# Patient Record
Sex: Male | Born: 1990 | Race: Black or African American | Hispanic: No | Marital: Single | Smoking: Current some day smoker
Health system: Southern US, Community
[De-identification: ages and names within clinical notes are randomized; demographics above are authoritative.]

## PROBLEM LIST (undated history)

## (undated) DIAGNOSIS — J45909 Unspecified asthma, uncomplicated: Secondary | ICD-10-CM

---

## 2014-09-19 ENCOUNTER — Emergency Department (HOSPITAL_COMMUNITY)
Admission: EM | Admit: 2014-09-19 | Discharge: 2014-09-19 | Discharge status: Absent without leave | Payer: Self-pay | Source: Home / Self Care

## 2014-11-05 ENCOUNTER — Encounter (HOSPITAL_COMMUNITY): Payer: Self-pay | Admitting: Emergency Medicine

## 2014-11-05 ENCOUNTER — Emergency Department (INDEPENDENT_AMBULATORY_CARE_PROVIDER_SITE_OTHER)
Admission: EM | Admit: 2014-11-05 | Discharge: 2014-11-05 | Disposition: A | Discharge status: Home / Self Care | Payer: Self-pay | Source: Home / Self Care | Attending: Emergency Medicine | Admitting: Emergency Medicine

## 2014-11-05 DIAGNOSIS — J4541 Moderate persistent asthma with (acute) exacerbation: Secondary | ICD-10-CM

## 2014-11-05 MED ORDER — IPRATROPIUM-ALBUTEROL 0.5-2.5 (3) MG/3ML IN SOLN
RESPIRATORY_TRACT | Status: AC
  Filled 2014-11-05: qty 3

## 2014-11-05 MED ORDER — ALBUTEROL SULFATE HFA 108 (90 BASE) MCG/ACT IN AERS: 2.0000 | INHALATION_SPRAY | RESPIRATORY_TRACT | Status: DC | PRN

## 2014-11-05 MED ORDER — METHYLPREDNISOLONE ACETATE 80 MG/ML IJ SUSP
80.0000 mg | Freq: Once | INTRAMUSCULAR | Status: AC
  Administered 2014-11-05: 80 mg via INTRAMUSCULAR

## 2014-11-05 MED ORDER — IPRATROPIUM-ALBUTEROL 0.5-2.5 (3) MG/3ML IN SOLN
3.0000 mL | Freq: Once | RESPIRATORY_TRACT | Status: AC
  Administered 2014-11-05: 3 mL via RESPIRATORY_TRACT

## 2014-11-05 MED ORDER — METHYLPREDNISOLONE ACETATE 80 MG/ML IJ SUSP
INTRAMUSCULAR | Status: AC
  Filled 2014-11-05: qty 1

## 2014-11-05 MED ORDER — ALBUTEROL SULFATE (2.5 MG/3ML) 0.083% IN NEBU: 2.5000 mg | INHALATION_SOLUTION | Freq: Four times a day (QID) | RESPIRATORY_TRACT | Status: DC | PRN

## 2014-11-05 NOTE — ED Provider Notes (Signed)
CSN: [id number]     Arrival date & time 11/05/14  1811 History   First MD Initiated Contact with Patient 11/05/14 1827     Chief Complaint  Patient presents with  . Asthma   (Consider location/radiation/quality/duration/timing/severity/associated sxs/prior Treatment) HPI  He is a 24 year old man here for evaluation of asthma. He states he has a long history of chronic asthma. He ran out of his albuterol inhaler and nebulizer several days ago. Over the last few days he has had increasing cough and wheezing. He reports feeling short of breath. No fevers or chills. 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 He states his asthma symptoms are often worse in the warehouse. He is not on any controller medications.  History reviewed. No pertinent past medical history. No past surgical history on file. History reviewed. No pertinent family history. History  Substance Use Topics  . Smoking status: Not on file  . Smokeless tobacco: Not on file  . Alcohol Use: Not on file    Review of Systems As in history of present illness Allergies  Review of patient's allergies indicates no known allergies.  Home Medications   Prior to Admission medications   Not on File   BP 111/80 mmHg  Pulse 77  Temp(Src) 98 F (36.7 C) (Oral)  Resp 18  SpO2 95% Physical Exam  Constitutional: He is oriented to person, place, and time. He appears well-developed and well-nourished. No distress.  Cardiovascular: Normal rate and regular rhythm.   No murmur heard. Pulmonary/Chest: Effort normal. No respiratory distress. He has wheezes (diffuse expiratory wheezes). He has no rales.  Neurological: He is alert and oriented to person, place, and time.    ED Course  Procedures (including critical care time) Labs Review Labs Reviewed - No data to display  Imaging Review No results found.   MDM  No diagnosis found. DuoNeb 2.5-0.5 mg given. Depo-Medrol 80 mg IM given.  Wheezing is completely  resolved after nebulizer. We'll discharge home with prescriptions for albuterol inhaler as well as nebulizer. Letter provided for work. Follow-up as needed.  Charm Rings, MD 11/05/14 [phone]

## 2014-11-05 NOTE — ED Notes (Signed)
C/o asthma States he has been out of meds for a while Would like refill on inhaler and neb  States sob due to weather

## 2014-11-05 NOTE — Discharge Instructions (Signed)
Use the albuterol every 4-6 hours as needed. I think you would benefit from being on a controller medicine. Please work on finding a primary care doctor.

## 2014-12-22 ENCOUNTER — Encounter (HOSPITAL_COMMUNITY): Payer: Self-pay | Admitting: Emergency Medicine

## 2014-12-22 ENCOUNTER — Emergency Department (INDEPENDENT_AMBULATORY_CARE_PROVIDER_SITE_OTHER)
Admission: EM | Admit: 2014-12-22 | Discharge: 2014-12-22 | Disposition: A | Discharge status: Home / Self Care | Payer: Medicaid Other | Source: Home / Self Care | Attending: Family Medicine | Admitting: Family Medicine

## 2014-12-22 DIAGNOSIS — J45901 Unspecified asthma with (acute) exacerbation: Secondary | ICD-10-CM

## 2014-12-22 HISTORY — DX: Unspecified asthma, uncomplicated: J45.909

## 2014-12-22 MED ORDER — DEXAMETHASONE 4 MG PO TABS
ORAL_TABLET | ORAL | Status: AC
  Filled 2014-12-22: qty 2

## 2014-12-22 MED ORDER — PREDNISONE 50 MG PO TABS: ORAL_TABLET | ORAL | Status: DC

## 2014-12-22 MED ORDER — ALBUTEROL SULFATE HFA 108 (90 BASE) MCG/ACT IN AERS: 2.0000 | INHALATION_SPRAY | RESPIRATORY_TRACT | Status: DC | PRN

## 2014-12-22 MED ORDER — ALBUTEROL SULFATE (2.5 MG/3ML) 0.083% IN NEBU: 2.5000 mg | INHALATION_SOLUTION | Freq: Four times a day (QID) | RESPIRATORY_TRACT | Status: DC | PRN

## 2014-12-22 MED ORDER — DEXAMETHASONE 2 MG PO TABS
ORAL_TABLET | ORAL | Status: AC
  Filled 2014-12-22: qty 1

## 2014-12-22 MED ORDER — DEXAMETHASONE 4 MG PO TABS
10.0000 mg | ORAL_TABLET | Freq: Once | ORAL | Status: AC
  Administered 2014-12-22: 10 mg via ORAL

## 2014-12-22 NOTE — Discharge Instructions (Signed)
Please use the albuterol every 4 hours as needed for your asthma Please do not use this any more than needed as it becomes less effective with time Please follow up with Maggy to get a regular doctor

## 2014-12-22 NOTE — ED Notes (Signed)
Needing refills on his asthma meds Does not have a PCP Alert and oriented x4... No acute distress.

## 2014-12-22 NOTE — ED Provider Notes (Signed)
CSN: [id number]     Arrival date & time 12/22/14  1430 History   First MD Initiated Contact with Patient 12/22/14 1525     Chief Complaint  Patient presents with  . Asthma   (Consider location/radiation/quality/duration/timing/severity/associated sxs/prior Treatment) HPI  Asthma flare. Patient with multiple recent asthma flares. Currently he has been coughing and wheezing for the last couple days. He has used his albuterol every 4 hours while at work which where he says his symptoms are worse. Symptoms improved with his albuterol. Denies any fevers, chest pain, nausea, vomiting, neck stiffness, headache, rash. Symptoms are intermittent.  Past Medical History  Diagnosis Date  . Asthma    History reviewed. No pertinent past surgical history. No family history on file. Social History  Substance Use Topics  . Smoking status: Never Smoker   . Smokeless tobacco: None  . Alcohol Use: No    Review of Systems Per HPI with all other pertinent systems negative.   Allergies  Review of patient's allergies indicates no known allergies.  Home Medications   Prior to Admission medications   Medication Sig Start Date End Date Taking? Authorizing Provider  albuterol (PROVENTIL HFA;VENTOLIN HFA) 108 (90 BASE) MCG/ACT inhaler Inhale 2 puffs into the lungs every 4 (four) hours as needed for wheezing or shortness of breath. 12/22/14   Ozella Rocks, MD  albuterol (PROVENTIL) (2.5 MG/3ML) 0.083% nebulizer solution Take 3 mLs (2.5 mg total) by nebulization every 6 (six) hours as needed for wheezing or shortness of breath. 12/22/14   Ozella Rocks, MD  predniSONE (DELTASONE) 50 MG tablet Take daily with breakfast 12/22/14   Ozella Rocks, MD   Meds Ordered and Administered this Visit   Medications  dexamethasone (DECADRON) tablet 10 mg (not administered)    BP 103/66 mmHg  Pulse 70  Temp(Src) 97.7 F (36.5 C) (Oral)  Resp 16  SpO2 97% No data found.   Physical Exam Physical Exam   Constitutional: oriented to person, place, and time. appears well-developed and well-nourished. No distress.  HENT:  Head: Normocephalic and atraumatic.  Eyes: EOMI. PERRL.  Neck: Normal range of motion.  Cardiovascular: RRR, no m/r/g, 2+ distal pulses,  Pulmonary/Chest: Decreased breath sounds throughout, wheezing throughout, no rhonchi or crackles..  Abdominal: Soft. Bowel sounds are normal. NonTTP, no distension.  Musculoskeletal: Normal range of motion. Non ttp, no effusion.  Neurological: alert and oriented to person, place, and time.  Skin: Skin is warm. No rash noted. non diaphoretic.  Psychiatric: normal mood and affect. behavior is normal. Judgment and thought content normal.   ED Course  Procedures (including critical care time)  Labs Review Labs Reviewed - No data to display  Imaging Review No results found.   Visual Acuity Review  Right Eye Distance:   Left Eye Distance:   Bilateral Distance:    Right Eye Near:   Left Eye Near:    Bilateral Near:         MDM   1. Asthma attack    Patient refusing albuterol treatment at this point time. We'll give Decadron 10 mg by mouth. Patient to continue steroids for 5 days and use albuterol every 4 hours for the next 24 hours and every 4 hours as needed. Will refill patient's MDI as well as albuterol nebs.    Ozella Rocks, MD 12/22/14 [phone]

## 2014-12-24 ENCOUNTER — Inpatient Hospital Stay (HOSPITAL_COMMUNITY)
Admission: EM | Admit: 2014-12-24 | Discharge: 2014-12-28 | DRG: 208 | Disposition: A | Discharge status: Home / Self Care | Payer: Medicaid Other | Attending: Pulmonary Disease | Admitting: Pulmonary Disease

## 2014-12-24 ENCOUNTER — Encounter (HOSPITAL_COMMUNITY): Payer: Self-pay

## 2014-12-24 ENCOUNTER — Emergency Department (HOSPITAL_COMMUNITY): Payer: Medicaid Other

## 2014-12-24 DIAGNOSIS — J45902 Unspecified asthma with status asthmaticus: Secondary | ICD-10-CM

## 2014-12-24 DIAGNOSIS — J9601 Acute respiratory failure with hypoxia: Secondary | ICD-10-CM | POA: Diagnosis not present

## 2014-12-24 DIAGNOSIS — T380X5A Adverse effect of glucocorticoids and synthetic analogues, initial encounter: Secondary | ICD-10-CM | POA: Diagnosis present

## 2014-12-24 DIAGNOSIS — R739 Hyperglycemia, unspecified: Secondary | ICD-10-CM | POA: Diagnosis present

## 2014-12-24 DIAGNOSIS — J4552 Severe persistent asthma with status asthmaticus: Secondary | ICD-10-CM | POA: Diagnosis not present

## 2014-12-24 DIAGNOSIS — J9600 Acute respiratory failure, unspecified whether with hypoxia or hypercapnia: Secondary | ICD-10-CM

## 2014-12-24 DIAGNOSIS — F70 Mild intellectual disabilities: Secondary | ICD-10-CM | POA: Diagnosis present

## 2014-12-24 DIAGNOSIS — I959 Hypotension, unspecified: Secondary | ICD-10-CM | POA: Diagnosis not present

## 2014-12-24 DIAGNOSIS — R40243 Glasgow coma scale score 3-8: Secondary | ICD-10-CM | POA: Diagnosis present

## 2014-12-24 DIAGNOSIS — Z7952 Long term (current) use of systemic steroids: Secondary | ICD-10-CM

## 2014-12-24 DIAGNOSIS — E861 Hypovolemia: Secondary | ICD-10-CM | POA: Diagnosis present

## 2014-12-24 DIAGNOSIS — J45901 Unspecified asthma with (acute) exacerbation: Secondary | ICD-10-CM | POA: Diagnosis present

## 2014-12-24 DIAGNOSIS — Z79899 Other long term (current) drug therapy: Secondary | ICD-10-CM

## 2014-12-24 DIAGNOSIS — R451 Restlessness and agitation: Secondary | ICD-10-CM | POA: Diagnosis not present

## 2014-12-24 LAB — POCT I-STAT 3, ART BLOOD GAS (G3+)
ACID-BASE DEFICIT: 4 mmol/L — AB (ref 0.0–2.0)
Bicarbonate: 27.5 mEq/L — ABNORMAL HIGH (ref 20.0–24.0)
O2 Saturation: 100 %
PO2 ART: 459 mmHg — AB (ref 80.0–100.0)
Patient temperature: 97.5
TCO2: 30 mmol/L (ref 0–100)
pCO2 arterial: 81.9 mmHg (ref 35.0–45.0)
pH, Arterial: 7.13 — CL (ref 7.350–7.450)

## 2014-12-24 LAB — COMPREHENSIVE METABOLIC PANEL
ALK PHOS: 48 U/L (ref 38–126)
ALT: 26 U/L (ref 17–63)
AST: 40 U/L (ref 15–41)
Albumin: 3.9 g/dL (ref 3.5–5.0)
Anion gap: 10 (ref 5–15)
BUN: 11 mg/dL (ref 6–20)
CALCIUM: 8.9 mg/dL (ref 8.9–10.3)
CHLORIDE: 104 mmol/L (ref 101–111)
CO2: 24 mmol/L (ref 22–32)
CREATININE: 1.1 mg/dL (ref 0.61–1.24)
Glucose, Bld: 219 mg/dL — ABNORMAL HIGH (ref 65–99)
Potassium: 3.5 mmol/L (ref 3.5–5.1)
Sodium: 138 mmol/L (ref 135–145)
Total Bilirubin: 1 mg/dL (ref 0.3–1.2)
Total Protein: 6.8 g/dL (ref 6.5–8.1)

## 2014-12-24 LAB — URINALYSIS, ROUTINE W REFLEX MICROSCOPIC
BILIRUBIN URINE: NEGATIVE
GLUCOSE, UA: 250 mg/dL — AB
Ketones, ur: NEGATIVE mg/dL
Leukocytes, UA: NEGATIVE
Nitrite: NEGATIVE
Protein, ur: 30 mg/dL — AB
SPECIFIC GRAVITY, URINE: 1.011 (ref 1.005–1.030)
Urobilinogen, UA: 0.2 mg/dL (ref 0.0–1.0)
pH: 5 (ref 5.0–8.0)

## 2014-12-24 LAB — CBC WITH DIFFERENTIAL/PLATELET
BASOS PCT: 0 % (ref 0–1)
Basophils Absolute: 0 10*3/uL (ref 0.0–0.1)
EOS PCT: 2 % (ref 0–5)
Eosinophils Absolute: 0.5 10*3/uL (ref 0.0–0.7)
HEMATOCRIT: 43.7 % (ref 39.0–52.0)
HEMOGLOBIN: 15 g/dL (ref 13.0–17.0)
LYMPHS PCT: 28 % (ref 12–46)
Lymphs Abs: 7.4 10*3/uL — ABNORMAL HIGH (ref 0.7–4.0)
MCH: 29.4 pg (ref 26.0–34.0)
MCHC: 34.3 g/dL (ref 30.0–36.0)
MCV: 85.5 fL (ref 78.0–100.0)
MONOS PCT: 8 % (ref 3–12)
Monocytes Absolute: 2.1 10*3/uL — ABNORMAL HIGH (ref 0.1–1.0)
NEUTROS ABS: 16.6 10*3/uL — AB (ref 1.7–7.7)
NEUTROS PCT: 62 % (ref 43–77)
Platelets: 279 10*3/uL (ref 150–400)
RBC: 5.11 MIL/uL (ref 4.22–5.81)
RDW: 12.6 % (ref 11.5–15.5)
WBC: 26.6 10*3/uL — ABNORMAL HIGH (ref 4.0–10.5)

## 2014-12-24 LAB — URINE MICROSCOPIC-ADD ON

## 2014-12-24 LAB — MRSA PCR SCREENING: MRSA by PCR: NEGATIVE

## 2014-12-24 MED ORDER — ALBUTEROL SULFATE (2.5 MG/3ML) 0.083% IN NEBU
2.5000 mg | INHALATION_SOLUTION | RESPIRATORY_TRACT | Status: DC
  Administered 2014-12-24 – 2014-12-25 (×5): 2.5 mg via RESPIRATORY_TRACT
  Filled 2014-12-24 (×5): qty 3

## 2014-12-24 MED ORDER — ALBUTEROL (5 MG/ML) CONTINUOUS INHALATION SOLN
INHALATION_SOLUTION | RESPIRATORY_TRACT | Status: AC
  Filled 2014-12-24: qty 20

## 2014-12-24 MED ORDER — PANTOPRAZOLE SODIUM 40 MG PO TBEC: 40.0000 mg | DELAYED_RELEASE_TABLET | Freq: Every day | ORAL | Status: DC

## 2014-12-24 MED ORDER — DEXTROSE 5 % IV SOLN
0.0000 ug/min | INTRAVENOUS | Status: DC
  Administered 2014-12-24 – 2014-12-25 (×2): 30 ug/min via INTRAVENOUS
  Filled 2014-12-24 (×2): qty 4

## 2014-12-24 MED ORDER — HEPARIN SODIUM (PORCINE) 5000 UNIT/ML IJ SOLN
5000.0000 [IU] | Freq: Three times a day (TID) | INTRAMUSCULAR | Status: DC
  Administered 2014-12-24 – 2014-12-28 (×11): 5000 [IU] via SUBCUTANEOUS
  Filled 2014-12-24 (×11): qty 1

## 2014-12-24 MED ORDER — PANTOPRAZOLE SODIUM 40 MG PO PACK
40.0000 mg | PACK | Freq: Every day | ORAL | Status: DC
  Administered 2014-12-24 – 2014-12-25 (×2): 40 mg
  Filled 2014-12-24 (×3): qty 20

## 2014-12-24 MED ORDER — ANTISEPTIC ORAL RINSE SOLUTION (CORINZ)
7.0000 mL | OROMUCOSAL | Status: DC
  Administered 2014-12-25 – 2014-12-26 (×15): 7 mL via OROMUCOSAL

## 2014-12-24 MED ORDER — ALBUTEROL SULFATE (2.5 MG/3ML) 0.083% IN NEBU
5.0000 mg | INHALATION_SOLUTION | RESPIRATORY_TRACT | Status: DC | PRN
  Administered 2014-12-24 – 2014-12-25 (×2): 5 mg via RESPIRATORY_TRACT
  Filled 2014-12-24 (×3): qty 6

## 2014-12-24 MED ORDER — SODIUM CHLORIDE 0.9 % IV SOLN
INTRAVENOUS | Status: AC | PRN
  Administered 2014-12-24: 1000 mL via INTRAVENOUS

## 2014-12-24 MED ORDER — MIDAZOLAM HCL 2 MG/2ML IJ SOLN
INTRAMUSCULAR | Status: AC
  Administered 2014-12-24: 4 mg
  Filled 2014-12-24: qty 4

## 2014-12-24 MED ORDER — PROPOFOL 1000 MG/100ML IV EMUL
5.0000 ug/kg/min | Freq: Once | INTRAVENOUS | Status: DC
  Administered 2014-12-24: 1000 mg via INTRAVENOUS

## 2014-12-24 MED ORDER — SODIUM CHLORIDE 0.9 % IV SOLN: 250.0000 mL | INTRAVENOUS | Status: DC | PRN

## 2014-12-24 MED ORDER — CHLORHEXIDINE GLUCONATE 0.12% ORAL RINSE (MEDLINE KIT)
15.0000 mL | Freq: Two times a day (BID) | OROMUCOSAL | Status: DC
  Administered 2014-12-24 – 2014-12-26 (×4): 15 mL via OROMUCOSAL

## 2014-12-24 MED ORDER — SODIUM CHLORIDE 0.9 % IV SOLN
25.0000 ug/h | INTRAVENOUS | Status: DC
  Administered 2014-12-24: 50 ug/h via INTRAVENOUS
  Administered 2014-12-25: 100 ug/h via INTRAVENOUS
  Filled 2014-12-24 (×2): qty 50

## 2014-12-24 MED ORDER — ETOMIDATE 2 MG/ML IV SOLN
INTRAVENOUS | Status: AC | PRN
  Administered 2014-12-24: 20 mg via INTRAVENOUS

## 2014-12-24 MED ORDER — ROCURONIUM BROMIDE 50 MG/5ML IV SOLN
INTRAVENOUS | Status: AC | PRN
  Administered 2014-12-24: 70 mg via INTRAVENOUS

## 2014-12-24 MED ORDER — ALBUTEROL (5 MG/ML) CONTINUOUS INHALATION SOLN
10.0000 mg/h | INHALATION_SOLUTION | RESPIRATORY_TRACT | Status: DC
  Administered 2014-12-24: 10 mg/h via RESPIRATORY_TRACT
  Filled 2014-12-24: qty 20

## 2014-12-24 MED ORDER — PROPOFOL 1000 MG/100ML IV EMUL
INTRAVENOUS | Status: AC
  Administered 2014-12-24: 1000 mg via INTRAVENOUS
  Filled 2014-12-24: qty 100

## 2014-12-24 MED ORDER — PROPOFOL 1000 MG/100ML IV EMUL
5.0000 ug/kg/min | INTRAVENOUS | Status: DC
  Administered 2014-12-24 – 2014-12-25 (×2): 50 ug/kg/min via INTRAVENOUS
  Administered 2014-12-25 – 2014-12-26 (×3): 40 ug/kg/min via INTRAVENOUS
  Filled 2014-12-24 (×6): qty 100

## 2014-12-24 MED ORDER — MIDAZOLAM HCL 2 MG/2ML IJ SOLN: 4.0000 mg | Freq: Once | INTRAMUSCULAR | Status: DC

## 2014-12-24 MED ORDER — METHYLPREDNISOLONE SODIUM SUCC 125 MG IJ SOLR
80.0000 mg | Freq: Two times a day (BID) | INTRAMUSCULAR | Status: DC
  Administered 2014-12-24: 80 mg via INTRAVENOUS
  Filled 2014-12-24: qty 2

## 2014-12-24 NOTE — ED Notes (Signed)
Critical Care paged to [phone] .

## 2014-12-24 NOTE — ED Notes (Signed)
RT at bedside.

## 2014-12-24 NOTE — Progress Notes (Signed)
eLink Physician-Brief Progress Note Patient Name: Jeremy Molina DOB: 1990/11/16 MRN: [id number]   Date of Service  12/24/2014  HPI/Events of Note  Severe agitation on vent despite propofol Still awaiting ABG  eICU Interventions  Fentanyl gtt Versed  IV bolus now ABG now     Intervention Category Major Interventions: Change in mental status - evaluation and management  [name] 12/24/2014, 9:18 PM

## 2014-12-24 NOTE — ED Notes (Signed)
Propofol increased per critical care md increase to 40 mcg/kg/min

## 2014-12-24 NOTE — ED Notes (Signed)
Patient here for resp distress, hx of asthma, had attack today and on arrival to ed pt obtunded on nrb. Per ems could not tolerate cpap and attempted to intubate but unsuccsessful.

## 2014-12-24 NOTE — Code Documentation (Signed)
Preparing for intubation

## 2014-12-24 NOTE — ED Notes (Signed)
By ems pt was given solumedrol 125 mg, albuterol 15 atrovent 1. And magnesium 2 gm and 0.3 epi.

## 2014-12-24 NOTE — ED Notes (Signed)
RT at bedside setting up for Arterial Line Placement.

## 2014-12-24 NOTE — Progress Notes (Signed)
eLink Physician-Brief Progress Note Patient Name: Jeremy Molina DOB: 11/22/1990 MRN: [id number]   Date of Service  12/24/2014  HPI/Events of Note  Respiratory failure ABG with 7.13/80/440  eICU Interventions  Decrease FiO2 Change vent settings to RR 20, TVol 8cc is OK for now Will permit hypercapnea, as he is oxygenating well and his circulation is intact>  need to avoid barotrauma Add propofol to fentanyl gtt for sedation     Intervention Category Major Interventions: Respiratory failure - evaluation and management  MCQUAID, DOUGLAS 12/24/2014, 9:57 PM

## 2014-12-24 NOTE — H&P (Signed)
PULMONARY / CRITICAL CARE MEDICINE HISTORY AND PHYSICAL EXAMINATION   Name: Jeremy Molina MRN: [id number] DOB: [birth year]    ADMISSION DATE:  12/24/2014  PRIMARY SERVICE: PCCM  CHIEF COMPLAINT:  Respiratory Failure  BRIEF PATIENT DESCRIPTION: 25 M with severe persistent asthma presenting with respiratory failure 2/2 likely status asthmaticus. Intubated in ED. PCCM asked to admit.  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 He is currently intubated and sedated and unable to provide any history. His aunt and uncle accompany him. They note that he first developed SOB on 9/8 which responded initially to albuterol. It worsened on 9/9 and he presented to urgent care. He was given steroids and nebulized albuterol and sent home. He breathing worsened throughout the weekend and his aunt and uncle called EMS this evening. By the time the patient arrived at [facility] he was obtunded and was intubated.   PAST MEDICAL HISTORY :  Past Medical History  Diagnosis Date  . Asthma    History reviewed. No pertinent past surgical history. Prior to Admission medications   Medication Sig Start Date End Date Taking? Authorizing Provider  albuterol (PROVENTIL HFA;VENTOLIN HFA) 108 (90 BASE) MCG/ACT inhaler Inhale 2 puffs into the lungs every 4 (four) hours as needed for wheezing or shortness of breath. 12/22/14   Ozella Rocks, MD  albuterol (PROVENTIL) (2.5 MG/3ML) 0.083% nebulizer solution Take 3 mLs (2.5 mg total) by nebulization every 6 (six) hours as needed for wheezing or shortness of breath. 12/22/14   Ozella Rocks, MD  predniSONE (DELTASONE) 50 MG tablet Take daily with breakfast 12/22/14   Ozella Rocks, MD   No Known Allergies  FAMILY HISTORY:   History reviewed. No pertinent family history. SOCIAL HISTORY:  reports that he has never smoked. He does not have any smokeless tobacco history on file. He reports that he does not drink alcohol or use illicit drugs.  REVIEW OF SYSTEMS:  Unable to obtain secondary to patient condition.  SUBJECTIVE:   VITAL SIGNS: Temp:  [96.4 F (35.8 C)-97.5 F (36.4 C)] 97.5 F (36.4 C) (09/11 2145) Pulse Rate:  [74-131] 98 (09/11 2220) Resp:  [14-20] 20 (09/11 2220) BP: (68-160)/(38-103) 88/40 mmHg (09/11 2215) SpO2:  [99 %-100 %] 100 % (09/11 2220) Arterial Line BP: (77-115)/(39-56) 96/51 mmHg (09/11 2220) FiO2 (%):  [40 %-50 %] 40 % (09/11 2208) Weight:  [139 lb 15.9 oz (63.5 kg)-140 lb (63.504 kg)] 139 lb 15.9 oz (63.5 kg) (09/11 2145) HEMODYNAMICS:   VENTILATOR SETTINGS: Vent Mode:  [-] PRVC FiO2 (%):  [40 %-50 %] 40 % Set Rate:  [20 bmp] 20 bmp Vt Set:  [782 mL] 620 mL PEEP:  [5 cmH20] 5 cmH20 Plateau Pressure:  [21 cmH20] 21 cmH20 INTAKE / OUTPUT: Intake/Output      09/11 0701 - 09/12 0700   I.V. (mL/kg) 510 (8)   Total Intake(mL/kg) 510 (8)   Net +510         PHYSICAL EXAMINATION: General:  WDWN M intubated and sedated Neuro:  Sedated, HEENT:  Sclera anicteric, conjunctiva pink, MMM, ETT present Neck: Trachea supple and midline, mild bilaleral cervical LAN Cardiovascular:  RRR, NS1/S2, (-) MRG Lungs:  Absent breath sounds posteriorly, wheezing diffusely anteriorly Abdomen:  S/NT/ND/(+)BS Musculoskeletal:  (-)  C/C/E Skin:  No rashes  LABS:  CBC  Recent Labs Lab 12/24/14 1939  WBC 26.6*  HGB 15.0  HCT 43.7  PLT 279   Coag's No results for input(s): APTT, INR in the last 168 hours. BMET  Recent Labs Lab 12/24/14 1939  NA 138  K 3.5  CL 104  CO2 24  BUN 11  CREATININE 1.10  GLUCOSE 219*   Electrolytes  Recent Labs Lab 12/24/14 1939  CALCIUM 8.9   Sepsis Markers No results for input(s): LATICACIDVEN, PROCALCITON, O2SATVEN in the last 168  hours. ABG  Recent Labs Lab 12/24/14 2146  PHART 7.130*  PCO2ART 81.9*  PO2ART 459.0*   Liver Enzymes  Recent Labs Lab 12/24/14 1939  AST 40  ALT 26  ALKPHOS 48  BILITOT 1.0  ALBUMIN 3.9   Cardiac Enzymes No results for input(s): TROPONINI, PROBNP in the last 168 hours. Glucose No results for input(s): GLUCAP in the last 168 hours.  Imaging Dg Chest Portable 1 View  12/24/2014   CLINICAL DATA:  Intubation.  Asthma.  EXAM: PORTABLE CHEST - 1 VIEW  COMPARISON:  None.  FINDINGS: Support apparatus: Endotracheal tube tip 58 mm from the carina. This could be advanced 1 or 2 cm. Enteric tube is present with the tip not visible. Monitoring leads project over the chest.  Cardiomediastinal Silhouette: Cardiopericardial silhouette within normal limits.  Lungs: Lungs appear normal. No pneumothorax. Both costophrenic angles are excluded from view.  Effusions:  None.  Other:  None.  IMPRESSION: 1. Endotracheal tube tip 58 mm from the carina. 2. No active cardiopulmonary disease.   Electronically Signed   By: Andreas Newport M.D.   On: 12/24/2014 20:19    EKG: Not obtained. CXR: Hyperinflated.   ASSESSMENT / PLAN:  Active Problems:   Status asthmaticus   PULMONARY A: Status Asthmaticus: Possibly precipitated by respiratory virus. Asthma very poorly controlled.  P:   Lung protective ventilation. Target pH of ~ 7.1-7.2 with careful vent titration to avoid autopeep; no autopeep detected on current rate of 14 q6 ABG Aggressive IV steroids q4 Albuterol and q1 PRNs Patient should not be discharged from the hospital without controller medication regimen, medicines in hand or confirmation that he can pay for them, and follow-up with a pulmonologist arranged  CARDIOVASCULAR A: No acute issue  RENAL A: No acute issue   GASTROINTESTINAL A: No acute issue.  P:   Nutrition consulted in anticipation of prolong ventilation  HEMATOLOGIC A: No acute issue, elevated WBC count almost  certainly 2/2 recent steroids   INFECTIOUS A: No acute issue P:   Check RVP  ENDOCRINE A: Hyperylcemia: Likely steroid induced P:   SSI Check A1c given glucose in urine  NEUROLOGIC A: No acute issues  BEST PRACTICE / DISPOSITION Level of Care:  ICU Primary Service:  PCCM Consultants:  None  Code Status:  Full Diet:  Consulting nutrition DVT Px:  SQH GI Px:  PPI Skin Integrity:  Intact Social / Family:  Aunt/Uncle updated at bedside by AB on 9/11  TODAY'S SUMMARY:   I have personally obtained a history, examined the patient, evaluated laboratory and imaging results, formulated the assessment and plan and placed orders.  CRITICAL CARE: The patient is critically ill with multiple organ systems failure and requires high complexity decision making for assessment and support, frequent evaluation and titration of therapies, application of advanced monitoring technologies and extensive interpretation of multiple databases. Critical Care Time devoted to patient care services described in this note  is 60 minutes.   Evalyn Casco, MD Pulmonary and Critical Care Medicine [facility] Pager: [phone]   12/24/2014, 10:53 PM

## 2014-12-24 NOTE — ED Provider Notes (Signed)
CSN: [id number]     Arrival date & time 12/24/14  1934 History   First MD Initiated Contact with Patient 12/24/14 1959     Chief Complaint  Patient presents with  . Respiratory Arrest     (Consider location/radiation/quality/duration/timing/severity/associated sxs/prior Treatment) Patient is a 24 y.o. male presenting with shortness of breath.  Shortness of Breath Severity:  Severe Onset quality:  Gradual Duration:  2 days Timing:  Constant Progression:  Worsening Chronicity:  Chronic Context: URI   Relieved by:  Nothing Ineffective treatments:  Inhaler (steroids) Associated symptoms: wheezing     Past Medical History  Diagnosis Date  . Asthma    History reviewed. No pertinent past surgical history. History reviewed. No pertinent family history. Social History  Substance Use Topics  . Smoking status: Never Smoker   . Smokeless tobacco: None  . Alcohol Use: No    Review of Systems  Unable to perform ROS: Acuity of condition  Respiratory: Positive for shortness of breath and wheezing.       Allergies  Review of patient's allergies indicates no known allergies.  Home Medications   Prior to Admission medications   Medication Sig Start Date End Date Taking? Authorizing Provider  albuterol (PROVENTIL HFA;VENTOLIN HFA) 108 (90 BASE) MCG/ACT inhaler Inhale 2 puffs into the lungs every 4 (four) hours as needed for wheezing or shortness of breath. 12/22/14   Ozella Rocks, MD  albuterol (PROVENTIL) (2.5 MG/3ML) 0.083% nebulizer solution Take 3 mLs (2.5 mg total) by nebulization every 6 (six) hours as needed for wheezing or shortness of breath. 12/22/14   Ozella Rocks, MD  predniSONE (DELTASONE) 50 MG tablet Take daily with breakfast 12/22/14   Ozella Rocks, MD   BP 88/40 mmHg  Pulse 98  Temp(Src) 97.5 F (36.4 C) (Oral)  Resp 20  Ht 6' (1.829 m)  Wt 139 lb 15.9 oz (63.5 kg)  BMI 18.98 kg/m2  SpO2 100% Physical Exam  Constitutional: He appears well-developed and  well-nourished. He appears distressed.  HENT:  Head: Normocephalic and atraumatic.  Eyes: EOM are normal.  Neck: Normal range of motion.  Cardiovascular: Normal heart sounds.  Tachycardia present.   No murmur heard. Pulmonary/Chest: He is in respiratory distress. He has wheezes.  Abdominal: Soft. There is no tenderness.  Musculoskeletal: He exhibits no edema.  Neurological: He is unresponsive. GCS eye subscore is 1. GCS verbal subscore is 1. GCS motor subscore is 4.  Skin: No rash noted. He is diaphoretic.    ED Course  INTUBATION Date/Time: 12/24/2014 8:07 PM Performed by: Beverely Risen Authorized by: Beverely Risen Consent: The procedure was performed in an emergent situation. Indications: respiratory distress Intubation method: video-assisted Patient status: paralyzed (RSI) Preoxygenation: nonrebreather mask Sedatives: etomidate Paralytic: rocuronium Tube size: 7.5 mm Tube type: cuffed Number of attempts: 1 Cricoid pressure: yes Post-procedure assessment: chest rise and ETCO2 monitor Breath sounds: equal and absent over the epigastrium Cuff inflated: yes ETT to lip: 24 cm Tube secured with: ETT holder Chest x-ray interpreted by me. Chest x-ray findings: endotracheal tube in appropriate position Patient tolerance: Patient tolerated the procedure well with no immediate complications   (including critical care time) Labs Review Labs Reviewed  CBC WITH DIFFERENTIAL/PLATELET - Abnormal; Notable for the following:    WBC 26.6 (*)    Neutro Abs 16.6 (*)    Lymphs Abs 7.4 (*)    Monocytes Absolute 2.1 (*)    All other components within normal limits  COMPREHENSIVE METABOLIC PANEL - Abnormal;  Notable for the following:    Glucose, Bld 219 (*)    All other components within normal limits  URINALYSIS, ROUTINE W REFLEX MICROSCOPIC (NOT AT [facility]) - Abnormal; Notable for the following:    Glucose, UA 250 (*)    Hgb urine dipstick MODERATE (*)    Protein, ur 30 (*)    All other  components within normal limits  URINE MICROSCOPIC-ADD ON - Abnormal; Notable for the following:    Casts HYALINE CASTS (*)    All other components within normal limits  POCT I-STAT 3, ART BLOOD GAS (G3+) - Abnormal; Notable for the following:    pH, Arterial 7.130 (*)    pCO2 arterial 81.9 (*)    pO2, Arterial 459.0 (*)    Bicarbonate 27.5 (*)    Acid-base deficit 4.0 (*)    All other components within normal limits  MRSA PCR SCREENING  BLOOD GAS, ARTERIAL  BLOOD GAS, ARTERIAL  I-STAT ARTERIAL BLOOD GAS, ED    Imaging Review Dg Chest Portable 1 View  12/24/2014   CLINICAL DATA:  Intubation.  Asthma.  EXAM: PORTABLE CHEST - 1 VIEW  COMPARISON:  None.  FINDINGS: Support apparatus: Endotracheal tube tip 58 mm from the carina. This could be advanced 1 or 2 cm. Enteric tube is present with the tip not visible. Monitoring leads project over the chest.  Cardiomediastinal Silhouette: Cardiopericardial silhouette within normal limits.  Lungs: Lungs appear normal. No pneumothorax. Both costophrenic angles are excluded from view.  Effusions:  None.  Other:  None.  IMPRESSION: 1. Endotracheal tube tip 58 mm from the carina. 2. No active cardiopulmonary disease.   Electronically Signed   By: Andreas Newport M.D.   On: 12/24/2014 20:19   I have personally reviewed and evaluated these images and lab results as part of my medical decision-making.   EKG Interpretation None      MDM   Final diagnoses:  Asthma, unspecified asthma severity, with acute exacerbation  Acute respiratory failure, unspecified whether with hypoxia or hypercapnia     Patient is a 24 year old male with a history of asthma that presents with an acute exacerbation. Patient was seen 2 days ago and prescribed prednisone. He was called today for worsening shortness of breath upon arrival he was altered. Patient was given magnesium, duonebs, Solu-Medrol. Patient was tried on BiPAP however was unable to tolerate and was ripping off  his face. Upon arrival to the ED the patient was very altered which is a contraindication of BiPAP so the decision was made to intubate. Patient had very significant restrictive lung sounds on exam. Patient was GCS 6. Patient was intubated as above. Patient will be admitted to ICU for further management of care. Patient's chest x-ray showed good tube placement and no evidence of pneumothorax.    Beverely Risen, MD 12/24/14 [id number]  Nelva Nay, MD 12/31/14 [phone]

## 2014-12-25 ENCOUNTER — Encounter: Payer: Self-pay | Admitting: Pulmonary Disease

## 2014-12-25 DIAGNOSIS — J9601 Acute respiratory failure with hypoxia: Secondary | ICD-10-CM

## 2014-12-25 DIAGNOSIS — I959 Hypotension, unspecified: Secondary | ICD-10-CM

## 2014-12-25 DIAGNOSIS — J4552 Severe persistent asthma with status asthmaticus: Secondary | ICD-10-CM

## 2014-12-25 LAB — CBC WITH DIFFERENTIAL/PLATELET
BASOS ABS: 0 10*3/uL (ref 0.0–0.1)
Basophils Relative: 0 % (ref 0–1)
EOS ABS: 0 10*3/uL (ref 0.0–0.7)
Eosinophils Relative: 0 % (ref 0–5)
HCT: 39.9 % (ref 39.0–52.0)
HEMOGLOBIN: 13.4 g/dL (ref 13.0–17.0)
LYMPHS ABS: 0.7 10*3/uL (ref 0.7–4.0)
LYMPHS PCT: 6 % — AB (ref 12–46)
MCH: 28.3 pg (ref 26.0–34.0)
MCHC: 33.6 g/dL (ref 30.0–36.0)
MCV: 84.2 fL (ref 78.0–100.0)
Monocytes Absolute: 0.6 10*3/uL (ref 0.1–1.0)
Monocytes Relative: 5 % (ref 3–12)
NEUTROS PCT: 89 % — AB (ref 43–77)
Neutro Abs: 9.7 10*3/uL — ABNORMAL HIGH (ref 1.7–7.7)
Platelets: 262 10*3/uL (ref 150–400)
RBC: 4.74 MIL/uL (ref 4.22–5.81)
RDW: 12.6 % (ref 11.5–15.5)
WBC: 10.9 10*3/uL — AB (ref 4.0–10.5)

## 2014-12-25 LAB — BASIC METABOLIC PANEL
ANION GAP: 10 (ref 5–15)
BUN: 12 mg/dL (ref 6–20)
CHLORIDE: 108 mmol/L (ref 101–111)
CO2: 21 mmol/L — ABNORMAL LOW (ref 22–32)
Calcium: 8.9 mg/dL (ref 8.9–10.3)
Creatinine, Ser: 1.19 mg/dL (ref 0.61–1.24)
GFR calc Af Amer: >60 mL/min (ref >60)
GFR calc non Af Amer: >60 mL/min (ref >60)
Glucose, Bld: 156 mg/dL — ABNORMAL HIGH (ref 65–99)
POTASSIUM: 4 mmol/L (ref 3.5–5.1)
SODIUM: 139 mmol/L (ref 135–145)

## 2014-12-25 LAB — POCT I-STAT 3, ART BLOOD GAS (G3+)
ACID-BASE DEFICIT: 6 mmol/L — AB (ref 0.0–2.0)
ACID-BASE DEFICIT: 6 mmol/L — AB (ref 0.0–2.0)
Acid-base deficit: 6 mmol/L — ABNORMAL HIGH (ref 0.0–2.0)
BICARBONATE: 19 meq/L — AB (ref 20.0–24.0)
BICARBONATE: 19.9 meq/L — AB (ref 20.0–24.0)
BICARBONATE: 20.1 meq/L (ref 20.0–24.0)
O2 SAT: 96 %
O2 Saturation: 89 %
O2 Saturation: 97 %
PCO2 ART: 39.3 mmHg (ref 35.0–45.0)
PCO2 ART: 37.9 mmHg (ref 35.0–45.0)
PH ART: 7.317 — AB (ref 7.350–7.450)
PH ART: 7.326 — AB (ref 7.350–7.450)
PO2 ART: 60 mmHg — AB (ref 80.0–100.0)
PO2 ART: 99 mmHg (ref 80.0–100.0)
Patient temperature: 97.8
TCO2: 20 mmol/L (ref 0–100)
TCO2: 21 mmol/L (ref 0–100)
TCO2: 21 mmol/L (ref 0–100)
pCO2 arterial: 33.8 mmHg — ABNORMAL LOW (ref 35.0–45.0)
pH, Arterial: 7.358 (ref 7.350–7.450)
pO2, Arterial: 82 mmHg (ref 80.0–100.0)

## 2014-12-25 LAB — GLUCOSE, CAPILLARY
GLUCOSE-CAPILLARY: 102 mg/dL — AB (ref 65–99)
GLUCOSE-CAPILLARY: 121 mg/dL — AB (ref 65–99)
Glucose-Capillary: 106 mg/dL — ABNORMAL HIGH (ref 65–99)
Glucose-Capillary: 128 mg/dL — ABNORMAL HIGH (ref 65–99)

## 2014-12-25 LAB — PATHOLOGIST SMEAR REVIEW

## 2014-12-25 MED ORDER — VITAL HIGH PROTEIN PO LIQD
1000.0000 mL | ORAL | Status: DC
  Filled 2014-12-25 (×2): qty 1000

## 2014-12-25 MED ORDER — ARFORMOTEROL TARTRATE 15 MCG/2ML IN NEBU
15.0000 ug | INHALATION_SOLUTION | Freq: Two times a day (BID) | RESPIRATORY_TRACT | Status: DC
  Administered 2014-12-25 – 2014-12-27 (×5): 15 ug via RESPIRATORY_TRACT
  Filled 2014-12-25 (×8): qty 2

## 2014-12-25 MED ORDER — ALBUTEROL SULFATE (2.5 MG/3ML) 0.083% IN NEBU
2.5000 mg | INHALATION_SOLUTION | Freq: Four times a day (QID) | RESPIRATORY_TRACT | Status: DC
  Administered 2014-12-25 – 2014-12-27 (×7): 2.5 mg via RESPIRATORY_TRACT
  Filled 2014-12-25 (×8): qty 3

## 2014-12-25 MED ORDER — BUDESONIDE 0.5 MG/2ML IN SUSP
0.5000 mg | Freq: Two times a day (BID) | RESPIRATORY_TRACT | Status: DC
  Administered 2014-12-25 – 2014-12-27 (×5): 0.5 mg via RESPIRATORY_TRACT
  Filled 2014-12-25 (×5): qty 2

## 2014-12-25 MED ORDER — SODIUM CHLORIDE 0.9 % IV SOLN
INTRAVENOUS | Status: DC
  Administered 2014-12-25: 12:00:00 via INTRAVENOUS

## 2014-12-25 MED ORDER — MONTELUKAST SODIUM 10 MG PO TABS
10.0000 mg | ORAL_TABLET | Freq: Every day | ORAL | Status: DC
  Administered 2014-12-25: 10 mg
  Filled 2014-12-25 (×2): qty 1

## 2014-12-25 MED ORDER — METHYLPREDNISOLONE SODIUM SUCC 125 MG IJ SOLR
60.0000 mg | Freq: Four times a day (QID) | INTRAMUSCULAR | Status: DC
  Administered 2014-12-25 – 2014-12-27 (×9): 60 mg via INTRAVENOUS
  Filled 2014-12-25 (×9): qty 2

## 2014-12-25 MED ORDER — SODIUM CHLORIDE 0.9 % IV SOLN
250.0000 mL | INTRAVENOUS | Status: DC
  Administered 2014-12-25 – 2014-12-26 (×3): 250 mL via INTRAVENOUS

## 2014-12-25 MED ORDER — METHYLPREDNISOLONE SODIUM SUCC 125 MG IJ SOLR: 60.0000 mg | Freq: Four times a day (QID) | INTRAMUSCULAR | Status: DC

## 2014-12-25 MED ORDER — VITAL AF 1.2 CAL PO LIQD
1000.0000 mL | ORAL | Status: DC
  Administered 2014-12-25: 1000 mL
  Filled 2014-12-25 (×4): qty 1000

## 2014-12-25 MED ORDER — SODIUM CHLORIDE 0.9 % IV BOLUS (SEPSIS)
500.0000 mL | Freq: Once | INTRAVENOUS | Status: AC
  Administered 2014-12-25: 500 mL via INTRAVENOUS

## 2014-12-25 MED ORDER — LORATADINE 10 MG PO TABS
10.0000 mg | ORAL_TABLET | Freq: Every day | ORAL | Status: DC
  Administered 2014-12-25: 10 mg
  Filled 2014-12-25: qty 1

## 2014-12-25 MED ORDER — INSULIN ASPART 100 UNIT/ML ~~LOC~~ SOLN
0.0000 [IU] | SUBCUTANEOUS | Status: DC
  Administered 2014-12-25 (×2): 3 [IU] via SUBCUTANEOUS

## 2014-12-25 NOTE — Progress Notes (Signed)
eLink Physician-Brief Progress Note Patient Name: Jeremy Molina DOB: June 29, 1990 MRN: [id number]   Date of Service  12/25/2014  HPI/Events of Note  Requested to order AM labs.   eICU Interventions  Will order CBC, BMP and ABG at 5 AM.      Intervention Category Minor Interventions: Routine modifications to care plan (e.g. PRN medications for pain, fever)  Kristilyn Coltrane [place] 12/25/2014, 1:40 AM

## 2014-12-25 NOTE — Progress Notes (Signed)
PULMONARY / CRITICAL CARE MEDICINE   Name: Jeremy Molina MRN: [id number] DOB: 1990-05-31    ADMISSION DATE:  12/24/2014  REFERRING MD :  ER  CHIEF COMPLAINT:  Short of breath  INITIAL PRESENTATION:  24 yo male presented with dyspnea from status asthmaticus, requiring intubation.  STUDIES:   SIGNIFICANT EVENTS: 9/11 Admit  SUBJECTIVE:  On pressors, sedation.  VITAL SIGNS: Temp:  [96.4 F (35.8 C)-98.4 F (36.9 C)] 98.4 F (36.9 C) (09/12 0724) Pulse Rate:  [74-131] 93 (09/12 0700) Resp:  [14-20] 20 (09/12 0700) BP: (68-160)/(34-103) 85/41 mmHg (09/12 0700) SpO2:  [89 %-100 %] 99 % (09/12 0700) Arterial Line BP: (77-147)/(39-81) 112/58 mmHg (09/12 0700) FiO2 (%):  [40 %-60 %] 50 % (09/12 0738) Weight:  [139 lb 15.9 oz (63.5 kg)-140 lb (63.504 kg)] 139 lb 15.9 oz (63.5 kg) (09/12 0401) VENTILATOR SETTINGS: Vent Mode:  [-] PRVC FiO2 (%):  [40 %-60 %] 50 % Set Rate:  [20 bmp] 20 bmp Vt Set:  [620 mL] 620 mL PEEP:  [5 cmH20] 5 cmH20 Plateau Pressure:  [18 cmH20-37 cmH20] 24 cmH20 INTAKE / OUTPUT:  Intake/Output Summary (Last 24 hours) at 12/25/14 0836 Last data filed at 12/25/14 0700  Gross per 24 hour  Intake 755.51 ml  Output    545 ml  Net 210.51 ml    PHYSICAL EXAMINATION: General: thin Neuro:  RASS -1, follows commands HEENT:  ETT in place Cardiovascular:  Regular, tachycardic Lungs:  B/l expiratory wheezing Abdomen:  Soft, non tender Musculoskeletal:  No edema Skin:  No rashes  LABS:  CBC  Recent Labs Lab 12/24/14 1939 12/25/14 0400  WBC 26.6* 10.9*  HGB 15.0 13.4  HCT 43.7 39.9  PLT 279 262   BMET  Recent Labs Lab 12/24/14 1939 12/25/14 0400  NA 138 139  K 3.5 4.0  CL 104 108  CO2 24 21*  BUN 11 12  CREATININE 1.10 1.19  GLUCOSE 219* 156*   Electrolytes  Recent Labs Lab 12/24/14 1939 12/25/14 0400  CALCIUM 8.9 8.9   ABG  Recent Labs Lab 12/25/14 12/25/14 0301 12/25/14 0432  PHART 7.358 7.317* 7.326*  PCO2ART 33.8*  39.3 37.9  PO2ART 82.0 60.0* 99.0   Liver Enzymes  Recent Labs Lab 12/24/14 1939  AST 40  ALT 26  ALKPHOS 48  BILITOT 1.0  ALBUMIN 3.9   Glucose No results for input(s): GLUCAP in the last 168 hours.  Imaging Dg Chest Portable 1 View  12/24/2014   CLINICAL DATA:  Intubation.  Asthma.  EXAM: PORTABLE CHEST - 1 VIEW  COMPARISON:  None.  FINDINGS: Support apparatus: Endotracheal tube tip 58 mm from the carina. This could be advanced 1 or 2 cm. Enteric tube is present with the tip not visible. Monitoring leads project over the chest.  Cardiomediastinal Silhouette: Cardiopericardial silhouette within normal limits.  Lungs: Lungs appear normal. No pneumothorax. Both costophrenic angles are excluded from view.  Effusions:  None.  Other:  None.  IMPRESSION: 1. Endotracheal tube tip 58 mm from the carina. 2. No active cardiopulmonary disease.   Electronically Signed   By: Andreas Newport M.D.   On: 12/24/2014 20:19     ASSESSMENT / PLAN:  PULMONARY ETT 9/11 >> A: Acute respiratory failure 2nd to status asthmaticus. P:   Full vent support >> adjust settings to avoid PEEPi >> can allow for permissive hypercapnia F/u CXR Change solumedrol to 60 mg q6h Add pulmicort, brovana, singulair, claritin Scheduled albuterol for now  CARDIOVASCULAR Lt radial aline  9/11 >>  A:  Hypotension 2nd to hypovolemia, and sedation. P:  Increase IV fluids Wean off pressors to keep MAP > 65  RENAL A:   No acute issues. P:   Monitor urine outpt, electrolytes  GASTROINTESTINAL A:   Nutrition. P:   Tube feeds while on vent  Protonix for SUP  HEMATOLOGIC A:   No acute issues. P:  SQ heparin for DVT prevention  INFECTIOUS A:   No evidence for infection. P:   Monitor clinically off Abx  ENDOCRINE A:   Steroid induced hyperglycemia.   P:   SSI  NEUROLOGIC A:   Sedation. Hx of mild mental retardation. P:   RASS goal: -1  Updated pt's family at bedside.  CC time 32  minutes.  Coralyn Helling, MD [facility] Pulmonary/Critical Care 12/25/2014, 8:43 AM Pager:  [phone] After 3pm call: [phone]

## 2014-12-25 NOTE — Care Management Note (Signed)
Case Management Note  Patient Details  Name: Jeremy Molina MRN: [id number] Date of Birth: [birth year]  Subjective/Objective:   Pt adm w asthma, vent                 Action/Plan:lives w fam   Expected Discharge Date:                 Expected Discharge Plan:     In-House Referral:     Discharge planning Services     Post Acute Care Choice:    Choice offered to:     DME Arranged:    DME Agency:     HH Arranged:    HH Agency:     Status of Service:     Medicare Important Message Given:    Date Medicare IM Given:    Medicare IM give by:    Date Additional Medicare IM Given:    Additional Medicare Important Message give by:     If discussed at Long Length of Stay Meetings, dates discussed:    Additional Comments: ur review done  Hanley Hays, RN 12/25/2014, 8:40 AM

## 2014-12-25 NOTE — Progress Notes (Signed)
Initial Nutrition Assessment   INTERVENTION:   Initiate Vital AF 1.2 @ 20 ml/hr via OG tube and increase by 10 ml every 4 hours to goal rate of 60 ml/hr.   Tube feeding regimen provides 1728 kcal, 108 grams of protein, and 1167 ml of H2O.  TF regimen and propofol at current rate providing 2079 total kcal/day (102 % of kcal needs)  NUTRITION DIAGNOSIS:   Inadequate oral intake related to inability to eat as evidenced by NPO status.   GOAL:   Patient will meet greater than or equal to 90% of their needs   MONITOR:   TF tolerance, I & O's, Vent status, Labs  REASON FOR ASSESSMENT:   Consult Enteral/tube feeding initiation and management  ASSESSMENT:   38 M with severe persistent asthma presenting with respiratory failure 2/2 likely status asthmaticus.  Patient is currently intubated on ventilator support MV: 10.9 L/min Temp (24hrs), Avg:98 F (36.7 C), Min:96.4 F (35.8 C), Max:100 F (37.8 C)  Propofol: 13.3 ml/hr provides 351 kcal per day from lipid  Spoke with RN, pt receiving bath currently. Unable to complete nutrition-focused physical exam at this time. Pt awake and able to follow commands.   Diet Order:  Diet NPO time specified  Skin:  Reviewed, no issues  Last BM:  9/11  Height:   Ht Readings from Last 1 Encounters:  12/24/14 6' (1.829 m)    Weight:   Wt Readings from Last 1 Encounters:  12/25/14 139 lb 15.9 oz (63.5 kg)    Ideal Body Weight:  80.9 kg  BMI:  Body mass index is 18.98 kg/(m^2).  Estimated Nutritional Needs:   Kcal:  2031  Protein:  90-110 grams  Fluid:  > 2 L/day  EDUCATION NEEDS:   No education needs identified at this time  Kendell Bane RD, LDN, CNSC [phone] Pager [phone] After Hours Pager

## 2014-12-26 ENCOUNTER — Inpatient Hospital Stay (HOSPITAL_COMMUNITY): Payer: Medicaid Other

## 2014-12-26 LAB — CBC
HCT: 37.8 % — ABNORMAL LOW (ref 39.0–52.0)
Hemoglobin: 12.6 g/dL — ABNORMAL LOW (ref 13.0–17.0)
MCH: 28.2 pg (ref 26.0–34.0)
MCHC: 33.3 g/dL (ref 30.0–36.0)
MCV: 84.6 fL (ref 78.0–100.0)
PLATELETS: 235 10*3/uL (ref 150–400)
RBC: 4.47 MIL/uL (ref 4.22–5.81)
RDW: 12.8 % (ref 11.5–15.5)
WBC: 13 10*3/uL — AB (ref 4.0–10.5)

## 2014-12-26 LAB — BLOOD GAS, ARTERIAL
Acid-Base Excess: 1.1 mmol/L (ref 0.0–2.0)
Bicarbonate: 25.2 mEq/L — ABNORMAL HIGH (ref 20.0–24.0)
DRAWN BY: 437071
FIO2: 0.35
MECHVT: 620 mL
O2 Saturation: 94.3 %
PEEP: 5 cmH2O
PH ART: 7.409 (ref 7.350–7.450)
Patient temperature: 99
RATE: 16 resp/min
TCO2: 26.5 mmol/L (ref 0–100)
pCO2 arterial: 40.8 mmHg (ref 35.0–45.0)
pO2, Arterial: 74.3 mmHg — ABNORMAL LOW (ref 80.0–100.0)

## 2014-12-26 LAB — BASIC METABOLIC PANEL
Anion gap: 4 — ABNORMAL LOW (ref 5–15)
BUN: 13 mg/dL (ref 6–20)
CALCIUM: 8.7 mg/dL — AB (ref 8.9–10.3)
CO2: 26 mmol/L (ref 22–32)
Chloride: 107 mmol/L (ref 101–111)
Creatinine, Ser: 0.91 mg/dL (ref 0.61–1.24)
GFR calc Af Amer: >60 mL/min (ref >60)
GLUCOSE: 125 mg/dL — AB (ref 65–99)
Potassium: 4.3 mmol/L (ref 3.5–5.1)
SODIUM: 137 mmol/L (ref 135–145)

## 2014-12-26 LAB — GLUCOSE, CAPILLARY
GLUCOSE-CAPILLARY: 116 mg/dL — AB (ref 65–99)
GLUCOSE-CAPILLARY: 124 mg/dL — AB (ref 65–99)

## 2014-12-26 LAB — MAGNESIUM: MAGNESIUM: 2.6 mg/dL — AB (ref 1.7–2.4)

## 2014-12-26 LAB — PHOSPHORUS: PHOSPHORUS: 3.2 mg/dL (ref 2.5–4.6)

## 2014-12-26 MED ORDER — MONTELUKAST SODIUM 10 MG PO TABS
10.0000 mg | ORAL_TABLET | Freq: Every day | ORAL | Status: DC
  Administered 2014-12-26 – 2014-12-27 (×2): 10 mg via ORAL
  Filled 2014-12-26 (×2): qty 1

## 2014-12-26 MED ORDER — ACETAMINOPHEN 325 MG PO TABS: 650.0000 mg | ORAL_TABLET | Freq: Four times a day (QID) | ORAL | Status: DC | PRN

## 2014-12-26 MED ORDER — CETYLPYRIDINIUM CHLORIDE 0.05 % MT LIQD
7.0000 mL | Freq: Two times a day (BID) | OROMUCOSAL | Status: DC
  Administered 2014-12-26 – 2014-12-28 (×4): 7 mL via OROMUCOSAL

## 2014-12-26 MED ORDER — LORATADINE 10 MG PO TABS
10.0000 mg | ORAL_TABLET | Freq: Every day | ORAL | Status: DC
Start: 2014-12-26 — End: 2014-12-28
  Administered 2014-12-26 – 2014-12-28 (×3): 10 mg via ORAL
  Filled 2014-12-26 (×3): qty 1

## 2014-12-26 MED ORDER — PANTOPRAZOLE SODIUM 40 MG PO PACK
40.0000 mg | PACK | Freq: Every day | ORAL | Status: DC
  Administered 2014-12-26 – 2014-12-28 (×2): 40 mg via ORAL
  Filled 2014-12-26 (×4): qty 20

## 2014-12-26 NOTE — Progress Notes (Addendum)
Wasted  IV fentanyl per protocol.

## 2014-12-26 NOTE — Progress Notes (Signed)
PULMONARY / CRITICAL CARE MEDICINE   Name: Jeremy Molina MRN: [id number] DOB: 1991/01/31    ADMISSION DATE:  12/24/2014  REFERRING MD :  ER  CHIEF COMPLAINT:  Short of breath  INITIAL PRESENTATION:  24 yo male presented with dyspnea from status asthmaticus, requiring intubation.  STUDIES:   SIGNIFICANT EVENTS: 9/11 Admit  SUBJECTIVE:  Almost of pressors.  Doing much better with pressure support.  VITAL SIGNS: Temp:  [98 F (36.7 C)-100 F (37.8 C)] 98 F (36.7 C) (09/13 0729) Pulse Rate:  [49-120] 49 (09/13 0729) Resp:  [14-20] 16 (09/13 0729) BP: (91-112)/(43-63) 106/60 mmHg (09/13 0700) SpO2:  [92 %-100 %] 98 % (09/13 0729) Arterial Line BP: (99-125)/(50-62) 119/62 mmHg (09/13 0600) FiO2 (%):  [35 %] 35 % (09/13 0729) Weight:  [142 lb 6.7 oz (64.6 kg)] 142 lb 6.7 oz (64.6 kg) (09/13 0500) VENTILATOR SETTINGS: Vent Mode:  [-] PRVC FiO2 (%):  [35 %] 35 % Set Rate:  [16 bmp] 16 bmp Vt Set:  [620 mL] 620 mL PEEP:  [5 cmH20] 5 cmH20 Plateau Pressure:  [20 cmH20-24 cmH20] 23 cmH20 INTAKE / OUTPUT:  Intake/Output Summary (Last 24 hours) at 12/26/14 0804 Last data filed at 12/26/14 0600  Gross per 24 hour  Intake 6225.55 ml  Output   1560 ml  Net 4665.55 ml    PHYSICAL EXAMINATION: General: thin Neuro:  RASS -1, follows commands, moves extremities HEENT:  ETT in place Cardiovascular:  Regular Lungs: better air movement, no wheeze, scattered rhonchi Abdomen:  Soft, non tender Musculoskeletal:  No edema Skin:  No rashes  LABS:  CBC  Recent Labs Lab 12/24/14 1939 12/25/14 0400 12/26/14 0300  WBC 26.6* 10.9* 13.0*  HGB 15.0 13.4 12.6*  HCT 43.7 39.9 37.8*  PLT 279 262 235   BMET  Recent Labs Lab 12/24/14 1939 12/25/14 0400 12/26/14 0300  NA 138 139 137  K 3.5 4.0 4.3  CL 104 108 107  CO2 24 21* 26  BUN 11 12 13   CREATININE 1.10 1.19 0.91  GLUCOSE 219* 156* 125*   Electrolytes  Recent Labs Lab 12/24/14 1939 12/25/14 0400  12/26/14 0300  CALCIUM 8.9 8.9 8.7*  MG  --   --  2.6*  PHOS  --   --  3.2   ABG  Recent Labs Lab 12/25/14 0301 12/25/14 0432 12/26/14 0315  PHART 7.317* 7.326* 7.409  PCO2ART 39.3 37.9 40.8  PO2ART 60.0* 99.0 74.3*   Liver Enzymes  Recent Labs Lab 12/24/14 1939  AST 40  ALT 26  ALKPHOS 48  BILITOT 1.0  ALBUMIN 3.9   Glucose  Recent Labs Lab 12/25/14 1136 12/25/14 1556 12/25/14 1939 12/25/14 2308 12/26/14 0302  GLUCAP 102* 106* 128* 121* 116*    Imaging Dg Chest Port 1 View  12/26/2014   CLINICAL DATA:  Asthma.  EXAM: PORTABLE CHEST - 1 VIEW  COMPARISON:  12/24/2014.  FINDINGS: Endotracheal tube and NG tube in stable position. Left lower lobe atelectatic changes and/or infiltrate noted. No pleural effusion or pneumothorax. No acute bony abnormality.  IMPRESSION: 1. Endotracheal tube and NG tube in stable position. 2. New onset left lower lobe atelectasis and/or infiltrate.   Electronically Signed   By: Maisie Fus  Register   On: 12/26/2014 07:18     ASSESSMENT / PLAN:  PULMONARY ETT 9/11 >> A: Acute respiratory failure 2nd to status asthmaticus. P:   Pressure support wean as tolerated >> likely will be able to extubate 9/13 once sedation wears off F/u CXR  intermittently Continue solumedrol to 60 mg q6h Continue pulmicort, brovana, singulair, claritin Scheduled albuterol for now  CARDIOVASCULAR Lt radial aline 9/11 >> 9/13 A:  Hypotension 2nd to hypovolemia, and sedation. P:  Continue IV fluids Wean off pressors to keep MAP > 65  RENAL A:   No acute issues. P:   Monitor urine outpt, electrolytes  GASTROINTESTINAL A:   Nutrition. P:   Advance diet after extubation Protonix for SUP  HEMATOLOGIC A:   No acute issues. P:  SQ heparin for DVT prevention  INFECTIOUS A:   No evidence for infection. P:   Monitor clinically off Abx  ENDOCRINE A:   Steroid induced hyperglycemia.   P:   SSI  NEUROLOGIC A:   Sedation. Hx of mild  mental retardation. P:   RASS goal: 0 while trying to push extubation  CC time 34 minutes.  Coralyn Helling, MD [facility] Pulmonary/Critical Care 12/26/2014, 8:04 AM Pager:  [phone] After 3pm call: [phone]

## 2014-12-26 NOTE — Procedures (Signed)
Extubation Procedure Note  Patient Details:   Name: Jeremy Molina DOB: Feb 16, 1991 MRN: [id number]   Airway Documentation:  Airway 7.5 mm (Active)  Secured at (cm) 25 cm 12/26/2014  8:21 AM  Measured From Lips 12/26/2014  8:21 AM  Secured Location Right 12/26/2014  8:21 AM  Secured By [employer] 12/26/2014  8:21 AM  Tube Holder Repositioned Yes 12/26/2014  8:21 AM  Cuff Pressure (cm H2O) 29 cm H2O 12/26/2014  3:12 AM  Site Condition Dry 12/26/2014  8:21 AM   Pt extubated to 4lpm [place], no distress noted.pt tolerated well. Evaluation  O2 sats: stable throughout Complications: No apparent complications Patient did tolerate procedure well. Bilateral Breath Sounds: Expiratory wheezes Suctioning: Oral, Airway Yes  [name] 12/26/2014, 8:48 AM

## 2014-12-27 LAB — BASIC METABOLIC PANEL
ANION GAP: 7 (ref 5–15)
BUN: 18 mg/dL (ref 6–20)
CHLORIDE: 107 mmol/L (ref 101–111)
CO2: 25 mmol/L (ref 22–32)
CREATININE: 0.96 mg/dL (ref 0.61–1.24)
Calcium: 8.7 mg/dL — ABNORMAL LOW (ref 8.9–10.3)
GFR calc non Af Amer: >60 mL/min (ref >60)
Glucose, Bld: 142 mg/dL — ABNORMAL HIGH (ref 65–99)
POTASSIUM: 4.4 mmol/L (ref 3.5–5.1)
Sodium: 139 mmol/L (ref 135–145)

## 2014-12-27 LAB — CBC
HEMATOCRIT: 39.1 % (ref 39.0–52.0)
HEMOGLOBIN: 12.9 g/dL — AB (ref 13.0–17.0)
MCH: 27.9 pg (ref 26.0–34.0)
MCHC: 33 g/dL (ref 30.0–36.0)
MCV: 84.6 fL (ref 78.0–100.0)
Platelets: 221 10*3/uL (ref 150–400)
RBC: 4.62 MIL/uL (ref 4.22–5.81)
RDW: 12.9 % (ref 11.5–15.5)
WBC: 10.5 10*3/uL (ref 4.0–10.5)

## 2014-12-27 MED ORDER — PREDNISONE 20 MG PO TABS
40.0000 mg | ORAL_TABLET | Freq: Every day | ORAL | Status: DC
  Administered 2014-12-27 – 2014-12-28 (×2): 40 mg via ORAL
  Filled 2014-12-27 (×2): qty 2

## 2014-12-27 MED ORDER — BUDESONIDE-FORMOTEROL FUMARATE 160-4.5 MCG/ACT IN AERO
2.0000 | INHALATION_SPRAY | Freq: Two times a day (BID) | RESPIRATORY_TRACT | Status: DC
  Administered 2014-12-27 – 2014-12-28 (×3): 2 via RESPIRATORY_TRACT
  Filled 2014-12-27 (×2): qty 6

## 2014-12-27 NOTE — Progress Notes (Addendum)
Nutrition Follow-up  INTERVENTION:   Continue to encourage meals and snacks.   NUTRITION DIAGNOSIS:   Inadequate oral intake related to inability to eat as evidenced by NPO status.  resolved  GOAL:   Patient will meet greater than or equal to 90% of their needs  Met.   MONITOR:   PO intake, Weight trends   ASSESSMENT:   19 M with severe persistent asthma presenting with respiratory failure 2/2 likely status asthmaticus.  Pt extubated and now eating 100% of his regular diet. Per MD likely home tomorrow but needs assistance with getting inhalers.  Pt denies any recent weight changes and reports good appetite at home.   Diet Order:  Diet regular Room service appropriate?: Yes; Fluid consistency:: Thin  Skin:  Reviewed, no issues  Last BM:  9/14  Height:   Ht Readings from Last 1 Encounters:  12/24/14 6' (1.829 m)    Weight:   Wt Readings from Last 1 Encounters:  12/27/14 137 lb 12.6 oz (62.5 kg)    Ideal Body Weight:  80.9 kg  BMI:  Body mass index is 18.68 kg/(m^2).  Estimated Nutritional Needs:   Kcal:  1900-2100  Protein:  75-90 grams  Fluid:  > 1.9 L/day  EDUCATION NEEDS:   No education needs identified at this time  [place], [place], [place] Pager [phone] After Hours Pager

## 2014-12-27 NOTE — Progress Notes (Signed)
PULMONARY / CRITICAL CARE MEDICINE   Name: Jeremy Molina MRN: [id number] DOB: June 16, 1990    ADMISSION DATE:  12/24/2014  REFERRING MD :  ER  CHIEF COMPLAINT:  Short of breath  INITIAL PRESENTATION:  24 yo male presented with dyspnea from status asthmaticus, requiring intubation.  SIGNIFICANT EVENTS: 9/11 Admit 9/13 Extubated, off pressors 9/14 To floor  SUBJECTIVE:  Breathing better.  Denies sinus congestion, sore throat, chest pain.  Tolerating diet.  VITAL SIGNS: Temp:  [98.2 F (36.8 C)-99.2 F (37.3 C)] 98.5 F (36.9 C) (09/14 0742) Pulse Rate:  [29-116] 79 (09/14 0800) Resp:  [15-23] 16 (09/14 0800) BP: (93-132)/(56-104) 115/72 mmHg (09/14 0800) SpO2:  [91 %-100 %] 91 % (09/14 0914) Weight:  [137 lb 12.6 oz (62.5 kg)] 137 lb 12.6 oz (62.5 kg) (09/14 0500) INTAKE / OUTPUT:  Intake/Output Summary (Last 24 hours) at 12/27/14 1105 Last data filed at 12/27/14 1000  Gross per 24 hour  Intake   2445 ml  Output    925 ml  Net   1520 ml    PHYSICAL EXAMINATION: General: thin, pleasant Neuro: normal strength HEENT: no sinus tenderness Cardiovascular:  Regular Lungs: no wheeze Abdomen:  Soft, non tender Musculoskeletal:  No edema Skin:  No rashes  LABS:  CBC  Recent Labs Lab 12/25/14 0400 12/26/14 0300 12/27/14 0239  WBC 10.9* 13.0* 10.5  HGB 13.4 12.6* 12.9*  HCT 39.9 37.8* 39.1  PLT 262 235 221   BMET  Recent Labs Lab 12/25/14 0400 12/26/14 0300 12/27/14 0239  NA 139 137 139  K 4.0 4.3 4.4  CL 108 107 107  CO2 21* 26 25  BUN CREATININE 1.19 0.91 0.96  GLUCOSE 156* 125* 142*   Electrolytes  Recent Labs Lab 12/25/14 0400 12/26/14 0300 12/27/14 0239  CALCIUM 8.9 8.7* 8.7*  MG  --  2.6*  --   PHOS  --  3.2  --    ABG  Recent Labs Lab 12/25/14 0301 12/25/14 0432 12/26/14 0315  PHART 7.317* 7.326* 7.409  PCO2ART 39.3 37.9 40.8  PO2ART 60.0* 99.0 74.3*   Liver Enzymes  Recent Labs Lab 12/24/14 1939  AST 40   ALT 26  ALKPHOS 48  BILITOT 1.0  ALBUMIN 3.9   Glucose  Recent Labs Lab 12/25/14 1136 12/25/14 1556 12/25/14 1939 12/25/14 2308 12/26/14 0302 12/26/14 0733  GLUCAP 102* 106* 128* 121* 116* 124*    Imaging No results found.   ASSESSMENT / PLAN:   ETT 9/11 >> 9/13 Lt radial aline 9/11 >> 9/13  Acute respiratory failure 2nd to status asthmaticus. P:   Change to prednisone 40 mg daily on 9/14 >> wean off as tolerated over next one week Change to symbicort and prn albuterol Continue singulair, claritin  Hypotension 2nd to hypovolemia, and sedation >> resolved. P:  D/c IV fluids  Steroid induced hyperglycemia >> improved.   P:   D/c SSI 9/14  Hx of mild mental retardation. P:   Monitor mental status  Transfer to floor 9/14.  Likely d/c home 9/15.  Will need outpt pulmonary follow up.  Will need assistance with applying for medicaid to ensure he can afford his inhaler regimen.   Coralyn Helling, MD [facility] Pulmonary/Critical Care 12/27/2014, 11:05 AM Pager:  [phone] After 3pm call: [phone]

## 2014-12-28 MED ORDER — LORATADINE 10 MG PO TABS: 10.0000 mg | ORAL_TABLET | Freq: Every day | ORAL | Status: DC

## 2014-12-28 MED ORDER — ALBUTEROL SULFATE HFA 108 (90 BASE) MCG/ACT IN AERS: 2.0000 | INHALATION_SPRAY | RESPIRATORY_TRACT | Status: DC | PRN

## 2014-12-28 MED ORDER — PREDNISONE 10 MG PO TABS: ORAL_TABLET | ORAL | Status: DC

## 2014-12-28 MED ORDER — BUDESONIDE-FORMOTEROL FUMARATE 160-4.5 MCG/ACT IN AERO: 2.0000 | INHALATION_SPRAY | Freq: Two times a day (BID) | RESPIRATORY_TRACT | Status: DC

## 2014-12-28 MED ORDER — ALBUTEROL SULFATE (2.5 MG/3ML) 0.083% IN NEBU: 2.5000 mg | INHALATION_SOLUTION | Freq: Four times a day (QID) | RESPIRATORY_TRACT | Status: DC | PRN

## 2014-12-28 MED ORDER — MONTELUKAST SODIUM 10 MG PO TABS: 10.0000 mg | ORAL_TABLET | Freq: Every day | ORAL | Status: DC

## 2014-12-28 NOTE — Discharge Summary (Signed)
Physician Discharge Summary  Patient ID: Jeremy Molina MRN: [id number] DOB/AGE: [birth year] 25 y.o.  Admit date: 12/24/2014 Discharge date: 12/28/2014    Discharge Diagnoses:  Active Problems:   Status asthmaticus    Brief Summary: Jeremy Molina is a 24 y.o. y/o male with a PMH of severe persistent asthma presented 9/11 with respiratory distress likely r/t status asthmaticus.  He declined rapidly requiring intubation in ER and PCCM called to admit.  He was admitted to ICU and treated with aggressive IV steroids, nebulized BD's and lung protective ventilation.  He responded quickly to therapies and was extubated 9/13.  He was transferred to the med-surg floor and is now back to baseline.  Ambulating on unit on RA, no audible wheeze, denies SOB.  He has been followed closely by Case Management and Social work who are assisting with medication/financial issues. He is medically cleared for d/c home pending outpt medication assistance arrangements and close outpt pulmonary f/u.    SIGNIFICANT EVENTS: 9/11 Admit 9/13 Extubated, off pressors 9/14 To floor  TUBES/LINES:  ETT 9/11 >> 9/13 Lt radial aline 9/11 >> 9/13                                                                 D/c Plan by Discharge Diagnosis  Acute respiratory failure 2nd to status asthmaticus. P:  Continue PO prednisone and wean off -- Take 4 tabs PO daily x 2 days, then 2 tabs PO daily x 2 days,  then 1 tab PO daily x 2 days, then STOP Change to symbicort and prn albuterol Continue singulair, claritin Outpt pulmonary f/u arranged as below   Hypotension 2nd to hypovolemia, and sedation >> resolved.  Steroid induced hyperglycemia >> resolved.    Filed Vitals:   12/27/14 1947 12/27/14 2010 12/27/14 2119 12/28/14 0556  BP: 120/63  114/69 109/61  Pulse: 95  72 78  Temp: 98.2 F (36.8 C)  98.2 F (36.8 C) 98.8 F (37.1 C)  TempSrc: Oral   Oral  Resp: _0 Height:      Weight:      SpO2: 93%  99%  97%     Discharge Labs  BMET  Recent Labs Lab 12/24/14 1939 12/25/14 0400 12/26/14 0300 12/27/14 0239  NA 138 139 137 139  K 3.5 4.0 4.3 4.4  CL 104 108 107 107  CO2 24 21* 26 25  GLUCOSE 219* 156* 125* 142*  BUN _1 CREATININE 1.10 1.19 0.91 0.96  CALCIUM 8.9 8.9 8.7* 8.7*  MG  --   --  2.6*  --   PHOS  --   --  3.2  --      CBC   Recent Labs Lab 12/25/14 0400 12/26/14 0300 12/27/14 0239  HGB 13.4 12.6* 12.9*  HCT 39.9 37.8* 39.1  WBC 10.9* 13.0* 10.5  PLT 262 235 221   Anti-Coagulation No results for input(s): INR in the last 168 hours.         Follow-up Information    Follow up with PARRETT,TAMMY, NP On 01/09/2015.   Specialty:  Nurse Practitioner   Why:  10:15am    Contact information:   [address] [address] [phone]  Medication List    TAKE these medications        albuterol 108 (90 BASE) MCG/ACT inhaler  Commonly known as:  PROVENTIL HFA;VENTOLIN HFA  Inhale 2 puffs into the lungs every 4 (four) hours as needed for wheezing or shortness of breath.     albuterol (2.5 MG/3ML) 0.083% nebulizer solution  Commonly known as:  PROVENTIL  Take 3 mLs (2.5 mg total) by nebulization every 6 (six) hours as needed for wheezing or shortness of breath.     budesonide-formoterol 160-4.5 MCG/ACT inhaler  Commonly known as:  SYMBICORT  Inhale 2 puffs into the lungs 2 (two) times daily.     loratadine 10 MG tablet  Commonly known as:  CLARITIN  Take 1 tablet (10 mg total) by mouth daily.     montelukast 10 MG tablet  Commonly known as:  SINGULAIR  Take 1 tablet (10 mg total) by mouth at bedtime.     predniSONE 10 MG tablet  Commonly known as:  DELTASONE  Take 4 tabs PO daily x 2 days, then 2 tabs PO daily x 2 days,  then 1 tab PO daily x 2 days, then STOP          Disposition: 01-Home or Self Care  Discharged Condition: Jeremy Molina has met maximum benefit of inpatient care and is medically  stable and cleared for discharge.  Patient is pending follow up as above.      Time spent on disposition:  Greater than 35 minutes.   SignedNickolas Madrid, NP 12/28/2014  9:28 AM Pager: [phone] or [phone]      Chesley Mires, MD [place] 12/28/2014, 10:08 AM Pager:  [phone] After 3pm call: [phone]

## 2014-12-28 NOTE — Clinical Social Work Note (Signed)
CSW consulted for assistance with Medicaid application for patient. CSW placed referral with Financial Counseling.  CSW signing off.  Marcelline Deist, [place] - [phone] Clinical Social Work Department Orthopedics [phone]) and Surgical [phone])

## 2014-12-28 NOTE — Care Management Note (Signed)
Case Management Note  Patient Details  Name: Jeremy Molina MRN: [id number] Date of Birth: 1991-01-08  Subjective/Objective:        Admitted with status asthmaticus            Action/Plan: Spoke with patient about [facility] program and gave him the [facility] letter and list of participating pharmacies. Gave patient a pharmacy discount card and information on  [facility] program. Contacted financial counselor, they are working with patient, Medicaid application is in process. Patient has a follow up appointment on 01/09/15. Patient stated that he lives with aunt and uncle and will have someone with him after discharge.  Expected Discharge Date:  12/27/14               Expected Discharge Plan:  Home/Self Care  In-House Referral:  Financial Counselor  Discharge planning Services  CM Consult, [facility] Program  Post Acute Care Choice:    Choice offered to:  NA  DME Arranged:    DME Agency:     HH Arranged:    HH Agency:     Status of Service:  Completed, signed off  Medicare Important Message Given:    Date Medicare IM Given:    Medicare IM give by:    Date Additional Medicare IM Given:    Additional Medicare Important Message give by:     If discussed at Long Length of Stay Meetings, dates discussed:    Additional Comments:  Monica Becton, RN 12/28/2014, 10:16 AM

## 2014-12-28 NOTE — Progress Notes (Signed)
Pt ready for d/c per MD. Has information about MATCH program from CM, and per request from NP, pt sent home with symbicort inhaler. Prescriptions and discharge teaching given to pt at bedside, all questions answered. Will be picked up via aunt and uncle.   Lowella Dell  12/28/2014

## 2014-12-28 NOTE — Discharge Instructions (Signed)
Asthma Asthma is a condition of the lungs in which the airways tighten and narrow. Asthma can make it hard to breathe. Asthma cannot be cured, but medicine and lifestyle changes can help control it. Asthma may be started (triggered) by:  Animal skin flakes (dander).  Dust.  Cockroaches.  Pollen.  Mold.  Smoke.  Cleaning products.  Hair sprays or aerosol sprays.  Paint fumes or strong smells.  Cold air, weather changes, and winds.  Crying or laughing hard.  Stress.  Certain medicines or drugs.  Foods, such as dried fruit, potato chips, and sparkling grape juice.  Infections or conditions (colds, flu).  Exercise.  Certain medical conditions or diseases.  Exercise or tiring activities. HOME CARE   Take medicine as told by your doctor.  Use a peak flow meter as told by your doctor. A peak flow meter is a tool that measures how well the lungs are working.  Record and keep track of the peak flow meter's readings.  Understand and use the asthma action plan. An asthma action plan is a written plan for taking care of your asthma and treating your attacks.  To help prevent asthma attacks:  Do not smoke. Stay away from secondhand smoke.  Change your heating and air conditioning filter often.  Limit your use of fireplaces and wood stoves.  Get rid of pests (such as roaches and mice) and their droppings.  Throw away plants if you see mold on them.  Clean your floors. Dust regularly. Use cleaning products that do not smell.  Have someone vacuum when you are not home. Use a vacuum cleaner with a HEPA filter if possible.  Replace carpet with wood, tile, or vinyl flooring. Carpet can trap animal skin flakes and dust.  Use allergy-proof pillows, mattress covers, and box spring covers.  Wash bed sheets and blankets every week in hot water and dry them in a dryer.  Use blankets that are made of polyester or cotton.  Clean bathrooms and kitchens with bleach. If  possible, have someone repaint the walls in these rooms with mold-resistant paint. Keep out of the rooms that are being cleaned and painted.  Wash hands often. GET HELP IF:  You have make a whistling sound when breaking (wheeze), have shortness of breath, or have a cough even if taking medicine to prevent attacks.  The colored mucus you cough up (sputum) is thicker than usual.  The colored mucus you cough up changes from clear or white to yellow, green, gray, or bloody.  You have problems from the medicine you are taking such as:  A rash.  Itching.  Swelling.  Trouble breathing.  You need reliever medicines more than 2-3 times a week.  Your peak flow measurement is still at 50-79% of your personal best after following the action plan for 1 hour.  You have a fever. GET HELP RIGHT AWAY IF:   You seem to be worse and are not responding to medicine during an asthma attack.  You are short of breath even at rest.  You get short of breath when doing very little activity.  You have trouble eating, drinking, or talking.  You have chest pain.  You have a fast heartbeat.  Your lips or fingernails start to turn blue.  You are light-headed, dizzy, or faint.  Your peak flow is less than 50% of your personal best. MAKE SURE YOU:   Understand these instructions.  Will watch your condition.  Will get help right away if you  are not doing well or get worse. Document Released: 09/17/2007 Document Revised: 08/15/2013 Document Reviewed: 10/28/2012 [facility] Patient Information 2015 [place], [place]. This information is not intended to replace advice given to you by your health care provider. Make sure you discuss any questions you have with your health care provider.  Acute Respiratory Failure Respiratory failure is when your lungs are not working well and your breathing (respiratory) system fails. When respiratory failure occurs, it is difficult for your lungs to get enough oxygen,  get rid of carbon dioxide, or both. Respiratory failure can be life threatening.  Respiratory failure can be acute or chronic. Acute respiratory failure is sudden, severe, and requires emergency medical treatment. Chronic respiratory failure is less severe, happens over time, and requires ongoing treatment.  WHAT ARE THE CAUSES OF ACUTE RESPIRATORY FAILURE?  Any problem affecting the heart or lungs can cause acute respiratory failure. Some of these causes include the following:  Chronic bronchitis and emphysema (COPD).   Blood clot going to a lung (pulmonary embolism).   Having water in the lungs caused by heart failure, lung injury, or infection (pulmonary edema).   Collapsed lung (pneumothorax).   Pneumonia.   Pulmonary fibrosis.   Obesity.   Asthma.   Heart failure.   Any type of trauma to the chest that can make breathing difficult.   Nerve or muscle diseases making chest movements difficult. WHAT SYMPTOMS SHOULD YOU WATCH FOR?  If you have any of these signs or symptoms, you should seek immediate medical care:   You have shortness of breath (dyspnea) with or without activity.   You have rapid, fast breathing (tachypnea).   You are wheezing.  You are unable to say more than a few words without having to catch your breath.  You find it very difficult to function normally.  You have a fast heart rate.   You have a bluish color to your finger or toe nail beds.   You have confusion or drowsiness or both.  HOW WILL MY ACUTE RESPIRATORY FAILURE BE TREATED?  Treatment of acute respiratory failure depends on the cause of the respiratory failure. Usually, you will stay in the intensive care unit so your breathing can be watched closely. Treatment can include the following:  Oxygen. Oxygen can be delivered through the following:  Nasal cannula. This is small tubing that goes in your nose to give you oxygen.  Face mask. A face mask covers your nose and mouth  to give you oxygen.  Medicine. Different medicines can be given to help with breathing. These can include:  Nebulizers. Nebulizers deliver medicines to open the air passages (bronchodilators). These medicines help to open or relax the airways in the lungs so you can breathe better. They can also help loosen mucus from your lungs.  Diuretics. Diuretic medicines can help you breathe better by getting rid of extra water in your body.  Steroids. Steroid medicines can help decrease swelling (inflammation) in your lungs.  Antibiotics.  Chest tube. If you have a collapsed lung (pneumothorax), a chest tube is placed to help reinflate the lung.  Non-invasive positive pressure ventilation (NPPV). This is a tight-fitting mask that goes over your nose and mouth. The mask has tubing that is attached to a machine. The machine blows air into the tubing, which helps to keep the tiny air sacs (alveoli) in your lungs open. This machine allows you to breathe on your own.  Ventilator. A ventilator is a breathing machine. When on a ventilator, a  breathing tube is put into the lungs. A ventilator is used when you can no longer breathe well enough on your own. You may have low oxygen levels or high carbon dioxide (CO2) levels in your blood. When you are on a ventilator, sedation and pain medicines are given to make you sleep so your lungs can heal. Document Released: 04/05/2013 Document Revised: 08/15/2013 Document Reviewed: 04/05/2013 [facility] Patient Information 2015 [place], [place]. This information is not intended to replace advice given to you by your health care provider. Make sure you discuss any questions you have with your health care provider.

## 2014-12-29 ENCOUNTER — Telehealth: Payer: Self-pay | Admitting: Adult Health

## 2014-12-29 NOTE — Telephone Encounter (Signed)
Called # provided--line would not ring out. Tried again and still would not ring. WCB

## 2014-12-31 ENCOUNTER — Emergency Department (INDEPENDENT_AMBULATORY_CARE_PROVIDER_SITE_OTHER)
Admission: EM | Admit: 2014-12-31 | Discharge: 2014-12-31 | Disposition: A | Discharge status: Home / Self Care | Payer: Medicaid Other | Source: Home / Self Care | Attending: Emergency Medicine | Admitting: Emergency Medicine

## 2014-12-31 ENCOUNTER — Encounter (HOSPITAL_COMMUNITY): Payer: Self-pay | Admitting: *Deleted

## 2014-12-31 DIAGNOSIS — J4550 Severe persistent asthma, uncomplicated: Secondary | ICD-10-CM | POA: Diagnosis not present

## 2014-12-31 NOTE — Discharge Instructions (Signed)
I have provided a note for you to return to work tomorrow. Please continue to take your medications as prescribed. Follow-up as needed.

## 2014-12-31 NOTE — ED Notes (Signed)
Pt      Reports  He  Is  Here  For  A  Note  For work      He  Wants  To  Return  tommorow    - he states he is  Doing  Better      And  Is  Taking his  meds

## 2014-12-31 NOTE — ED Provider Notes (Signed)
CSN: [id number]     Arrival date & time 12/31/14  1309 History   First MD Initiated Contact with Patient 12/31/14 1341     Chief Complaint  Patient presents with  . Follow-up   (Consider location/radiation/quality/duration/timing/severity/associated sxs/prior Treatment) HPI He is a 24 year old man here for a work note. He states he was admitted to the hospital on September 11 for asthma exacerbation. He was discharged on the 15th, but needs a note so he can return back to work. He states he has been taking his asthma medications as prescribed. This includes Symbicort, prednisone taper, and Singulair. He has been using his albuterol inhaler every 4 hours as instructed. He denies any shortness of breath, wheezing, cough.  Past Medical History  Diagnosis Date  . Asthma    History reviewed. No pertinent past surgical history. History reviewed. No pertinent family history. Social History  Substance Use Topics  . Smoking status: Never Smoker   . Smokeless tobacco: None  . Alcohol Use: No    Review of Systems As in history of present illness Allergies  Review of patient's allergies indicates no known allergies.  Home Medications   Prior to Admission medications   Medication Sig Start Date End Date Taking? Authorizing Provider  albuterol (PROVENTIL HFA;VENTOLIN HFA) 108 (90 BASE) MCG/ACT inhaler Inhale 2 puffs into the lungs every 4 (four) hours as needed for wheezing or shortness of breath. 12/28/14   Bernadene Person, NP  albuterol (PROVENTIL) (2.5 MG/3ML) 0.083% nebulizer solution Take 3 mLs (2.5 mg total) by nebulization every 6 (six) hours as needed for wheezing or shortness of breath. 12/28/14   Bernadene Person, NP  budesonide-formoterol (SYMBICORT) 160-4.5 MCG/ACT inhaler Inhale 2 puffs into the lungs 2 (two) times daily. 12/28/14   Bernadene Person, NP  loratadine (CLARITIN) 10 MG tablet Take 1 tablet (10 mg total) by mouth daily. 12/28/14   Bernadene Person, NP   montelukast (SINGULAIR) 10 MG tablet Take 1 tablet (10 mg total) by mouth at bedtime. 12/28/14   Bernadene Person, NP  predniSONE (DELTASONE) 10 MG tablet Take 4 tabs PO daily x 2 days, then 2 tabs PO daily x 2 days,  then 1 tab PO daily x 2 days, then STOP 12/28/14   Bernadene Person, NP   Meds Ordered and Administered this Visit  Medications - No data to display  BP 104/66 mmHg  Pulse 83  Temp(Src) 98.1 F (36.7 C) (Oral)  Resp 18  SpO2 97% No data found.   Physical Exam  Constitutional: He is oriented to person, place, and time. He appears well-developed and well-nourished. No distress.  Cardiovascular: Normal rate and regular rhythm.   No murmur heard. Pulmonary/Chest: Effort normal and breath sounds normal. No respiratory distress. He has no wheezes. He has no rales.  Neurological: He is alert and oriented to person, place, and time.    ED Course  Procedures (including critical care time)  Labs Review Labs Reviewed - No data to display  Imaging Review No results found.     MDM   1. Asthma, severe persistent, uncomplicated    Lungs are clear today. Discussed importance of taking all medications as prescribed. Work note, clearing him to return to work tomorrow given.    Charm Rings, MD 12/31/14 [phone]

## 2015-01-01 NOTE — Telephone Encounter (Signed)
atc number provided X2, fast busy signal.  Wcb.

## 2015-01-09 ENCOUNTER — Inpatient Hospital Stay: Payer: Medicaid Other | Admitting: Adult Health

## 2015-01-16 ENCOUNTER — Encounter: Payer: Self-pay | Admitting: Adult Health

## 2015-01-16 ENCOUNTER — Ambulatory Visit (INDEPENDENT_AMBULATORY_CARE_PROVIDER_SITE_OTHER): Payer: Medicaid Other | Admitting: Adult Health

## 2015-01-16 VITALS — BP 112/62 | HR 69 | Temp 98.5°F | Ht 69.0 in | Wt 143.0 lb

## 2015-01-16 DIAGNOSIS — J4551 Severe persistent asthma with (acute) exacerbation: Secondary | ICD-10-CM | POA: Diagnosis not present

## 2015-01-16 DIAGNOSIS — J45902 Unspecified asthma with status asthmaticus: Secondary | ICD-10-CM

## 2015-01-16 DIAGNOSIS — J45909 Unspecified asthma, uncomplicated: Secondary | ICD-10-CM | POA: Insufficient documentation

## 2015-01-16 MED ORDER — BUDESONIDE-FORMOTEROL FUMARATE 160-4.5 MCG/ACT IN AERO: 2.0000 | INHALATION_SPRAY | Freq: Two times a day (BID) | RESPIRATORY_TRACT | Status: DC

## 2015-01-16 MED ORDER — ALBUTEROL SULFATE (2.5 MG/3ML) 0.083% IN NEBU: 2.5000 mg | INHALATION_SOLUTION | Freq: Four times a day (QID) | RESPIRATORY_TRACT | Status: DC | PRN

## 2015-01-16 MED ORDER — MONTELUKAST SODIUM 10 MG PO TABS: 10.0000 mg | ORAL_TABLET | Freq: Every day | ORAL | Status: DC

## 2015-01-16 MED ORDER — ALBUTEROL SULFATE HFA 108 (90 BASE) MCG/ACT IN AERS: 2.0000 | INHALATION_SPRAY | RESPIRATORY_TRACT | Status: DC | PRN

## 2015-01-16 MED ORDER — LORATADINE 10 MG PO TABS: 10.0000 mg | ORAL_TABLET | Freq: Every day | ORAL | Status: DC

## 2015-01-16 NOTE — Assessment & Plan Note (Signed)
Recent flare with status asthmaticus  Requiring intubation -high risk  Pt education   Plan  Continue on Symbicort 2 puffs Twice daily  , rinse after use .  Continue on Claritin daily  Continue on Singulair daily  Follow up Dr. Isaiah Serge in 3-4 weeks with PFT  Please contact office for sooner follow up if symptoms do not improve or worsen or seek emergency care

## 2015-01-16 NOTE — Addendum Note (Signed)
Addended by: Karalee Height on: 01/16/2015 12:08 PM   Modules accepted: Orders

## 2015-01-16 NOTE — Progress Notes (Signed)
   Subjective:    Patient ID: Jeremy Molina, male    DOB: 1990-06-17, 24 y.o.   MRN: [id number]  HPI 24 yo male with asthma never smoker admitted by [facility] for status asthmaticus requiring intubation 12/2014.   01/16/2015 Post hospital follow up  Pt returns for post hospital follow up  Admitted 9/11 to 9/15 for asthma exacerbation w/ status asthmaticus requiring intubation  He was using albuterol medications PTA. No controller Has been intubated once before , unknown date in [place].  We discussed Asthma dx and treatment regimen  He was treated with IV steroids and nebs. Discharged on Symbicort and Singulair and  Prednisone taper.  He says he now has medicaid and can afford medications.  He is not working but denies previous occupational exposures Has dog.  Never smoker.  He is feeling much better with no cough or wheezing.  Denies chest pain, cough , wheezing or fever.   Review of Systems Constitutional:   No  weight loss, night sweats,  Fevers, chills, fatigue, or  lassitude.  HEENT:   No headaches,  Difficulty swallowing,  Tooth/dental problems, or  Sore throat,                No sneezing, itching, ear ache, nasal congestion, post nasal drip,   CV:  No chest pain,  Orthopnea, PND, swelling in lower extremities, anasarca, dizziness, palpitations, syncope.   GI  No heartburn, indigestion, abdominal pain, nausea, vomiting, diarrhea, change in bowel habits, loss of appetite, bloody stools.   Resp: No shortness of breath with exertion or at rest.  No excess mucus, no productive cough,  No non-productive cough,  No coughing up of blood.  No change in color of mucus.  No wheezing.  No chest wall deformity  Skin: no rash or lesions.  GU: no dysuria, change in color of urine, no urgency or frequency.  No flank pain, no hematuria   MS:  No joint pain or swelling.  No decreased range of motion.  No back pain.  Psych:  No change in mood or affect. No depression or anxiety.  No memory  loss.          Objective:   Physical Exam GEN: A/Ox3; pleasant , NAD, well nourished   HEENT:  [employer]/AT,  EACs-clear, TMs-wnl, NOSE-clear, THROAT-clear, no lesions, no postnasal drip or exudate noted.   NECK:  Supple w/ fair ROM; no JVD; normal carotid impulses w/o bruits; no thyromegaly or nodules palpated; no lymphadenopathy.  RESP  Clear  P & A; w/o, wheezes/ rales/ or rhonchi.no accessory muscle use, no dullness to percussion  CARD:  RRR, no m/r/g  , no peripheral edema, pulses intact, no cyanosis or clubbing.  GI:   Soft & nt; nml bowel sounds; no organomegaly or masses detected.  Musco: Warm bil, no deformities or joint swelling noted.   Neuro: alert, no focal deficits noted.    Skin: Warm, no lesions or rashes         Assessment & Plan:

## 2015-01-16 NOTE — Patient Instructions (Signed)
Continue on Symbicort 2 puffs Twice daily  , rinse after use .  Continue on Claritin daily  Continue on Singulair daily  Follow up Dr. Isaiah Serge in 3-4 weeks with PFT  Please contact office for sooner follow up if symptoms do not improve or worsen or seek emergency care

## 2015-01-16 NOTE — Assessment & Plan Note (Signed)
Resolved -high risk with 2 intubations in past  Pt education on asthma and med compliance  Explained controller vs rescue rx.

## 2015-02-15 ENCOUNTER — Other Ambulatory Visit: Payer: Self-pay | Admitting: Pulmonary Disease

## 2015-02-15 DIAGNOSIS — R0600 Dyspnea, unspecified: Secondary | ICD-10-CM

## 2015-02-16 ENCOUNTER — Encounter: Payer: Self-pay | Admitting: Adult Health

## 2015-02-16 ENCOUNTER — Ambulatory Visit (INDEPENDENT_AMBULATORY_CARE_PROVIDER_SITE_OTHER): Payer: Medicaid Other | Admitting: Pulmonary Disease

## 2015-02-16 ENCOUNTER — Ambulatory Visit (INDEPENDENT_AMBULATORY_CARE_PROVIDER_SITE_OTHER): Payer: Medicaid Other | Admitting: Adult Health

## 2015-02-16 VITALS — BP 104/72 | HR 63 | Temp 98.6°F | Ht 70.0 in | Wt 145.0 lb

## 2015-02-16 DIAGNOSIS — J4550 Severe persistent asthma, uncomplicated: Secondary | ICD-10-CM | POA: Diagnosis not present

## 2015-02-16 DIAGNOSIS — R0600 Dyspnea, unspecified: Secondary | ICD-10-CM

## 2015-02-16 LAB — PULMONARY FUNCTION TEST
DL/VA % PRED: 110 %
DL/VA: 5.2 ml/min/mmHg/L
DLCO UNC: 32.96 ml/min/mmHg
DLCO unc % pred: 101 %
FEF 25-75 POST: 2.27 L/s
FEF 25-75 Pre: 2.22 L/sec
FEF2575-%Change-Post: 2 %
FEF2575-%Pred-Post: 51 %
FEF2575-%Pred-Pre: 50 %
FEV1-%CHANGE-POST: 0 %
FEV1-%PRED-POST: 88 %
FEV1-%PRED-PRE: 89 %
FEV1-POST: 3.48 L
FEV1-Pre: 3.5 L
FEV1FVC-%Change-Post: 0 %
FEV1FVC-%PRED-PRE: 78 %
FEV6-%Change-Post: 0 %
FEV6-%PRED-POST: 113 %
FEV6-%Pred-Pre: 113 %
FEV6-POST: 5.22 L
FEV6-Pre: 5.23 L
FEV6FVC-%CHANGE-POST: 0 %
FEV6FVC-%PRED-POST: 101 %
FEV6FVC-%Pred-Pre: 100 %
FVC-%Change-Post: 0 %
FVC-%Pred-Post: 112 %
FVC-%Pred-Pre: 112 %
FVC-Post: 5.22 L
FVC-Pre: 5.24 L
PRE FEV6/FVC RATIO: 100 %
Post FEV1/FVC ratio: 67 %
Post FEV6/FVC ratio: 100 %
Pre FEV1/FVC ratio: 67 %
RV % PRED: 88 %
RV: 1.36 L
TLC % PRED: 92 %
TLC: 6.34 L

## 2015-02-16 MED ORDER — ALBUTEROL SULFATE HFA 108 (90 BASE) MCG/ACT IN AERS: 2.0000 | INHALATION_SPRAY | RESPIRATORY_TRACT | Status: DC | PRN

## 2015-02-16 NOTE — Progress Notes (Signed)
   Subjective:    Patient ID: Jeremy Molina, male    DOB: 08/27/1990, 24 y.o.   MRN: [id number]  HPI 24 yo male with asthma never smoker  [facility]hm[facility] intubation 12/2014. (hx of intubation x 3)   02/16/2015 Follow up : Asthma Returns for a one-month follow-up Patient was recently seen in the hospital for status asthmaticus requiring intubation. He was treated with IV steroids and nebulized bronchodilators. He was discharged on Symbicort and Singulair. She says he is feeling much improved. It appears that medication. Cost was a huge barrier prior to admission. He now has Medicaid and is able to afford his medications. Doesn't Symbicort has helped his symptoms tremendously.Marland Kitchen. He denies any cough, wheezing or shortness of breath. PFT shows a post bronchodilator FEV1 at 88%, ratio 67, no bronchodilator response, FVC at 112%, DLCO at 101% He denies chest pain, orthopnea, PND or leg swelling. He is accompanied by his mother He has rare use of his albuterol inhaler. We went over controller and as needed inhaler use Flu shot is utd .   Review of Systems Constitutional:   No  weight loss, night sweats,  Fevers, chills, fatigue, or  lassitude.  HEENT:   No headaches,  Difficulty swallowing,  Tooth/dental problems, or  Sore throat,                No sneezing, itching, ear ache, nasal congestion, post nasal drip,   CV:  No chest pain,  Orthopnea, PND, swelling in lower extremities, anasarca, dizziness, palpitations, syncope.   GI  No heartburn, indigestion, abdominal pain, nausea, vomiting, diarrhea, change in bowel habits, loss of appetite, bloody stools.   Resp: No shortness of breath with exertion or at rest.  No excess mucus, no productive cough,  No non-productive cough,  No coughing up of blood.  No change in color of mucus.  No wheezing.  No chest wall deformity  Skin: no rash or lesions.  GU: no dysuria, change in color of urine, no urgency or  frequency.  No flank pain, no hematuria   MS:  No joint pain or swelling.  No decreased range of motion.  No back pain.  Psych:  No change in mood or affect. No depression or anxiety.  No memory loss.          Objective:   Physical Exam GEN: A/Ox3; pleasant , NAD, well nourished   HEENT:  [place]/AT,  EACs-clear, TMs-wnl, NOSE-clear, THROAT-clear, no lesions, no postnasal drip or exudate noted.   NECK:  Supple w/ fair ROM; no JVD; normal carotid impulses w/o bruits; no thyromegaly or nodules palpated; no lymphadenopathy.  RESP  Clear  P & A; w/o, wheezes/ rales/ or rhonchi.no accessory muscle use, no dullness to percussion  CARD:  RRR, no m/r/g  , no peripheral edema, pulses intact, no cyanosis or clubbing.  GI:   Soft & nt; nml bowel sounds; no organomegaly or masses detected.  Musco: Warm bil, no deformities or joint swelling noted.   Neuro: alert, no focal deficits noted.    Skin: Warm, no lesions or rashes         Assessment & Plan:

## 2015-02-16 NOTE — Progress Notes (Signed)
PFT done today. 

## 2015-02-16 NOTE — Assessment & Plan Note (Signed)
Asthma with history of intubation 3 Patient's PFT shows preserved lung function with minimal airflow obstruction. Patient is improved on Symbicort.  PLAN Continue on Symbicort 2 puffs Twice daily  , rinse after use .  Continue on Claritin daily  Continue on Singulair daily  Follow up Dr. Isaiah SergeMannam in 3 months  And As needed    Please contact office for sooner follow up if symptoms do not improve or worsen or seek emergency care

## 2015-02-16 NOTE — Addendum Note (Signed)
Addended by: Karalee HeightOX, Johnica Armwood P on: 02/16/2015 12:01 PM   Modules accepted: Orders

## 2015-02-16 NOTE — Patient Instructions (Addendum)
Continue on Symbicort 2 puffs Twice daily  , rinse after use .  Continue on Claritin daily  Continue on Singulair daily  Follow up Dr. Isaiah SergeMannam in 3 months  And As needed    Please contact office for sooner follow up if symptoms do not improve or worsen or seek emergency care

## 2015-05-26 ENCOUNTER — Emergency Department (HOSPITAL_COMMUNITY)
Admission: EM | Admit: 2015-05-26 | Discharge: 2015-05-26 | Disposition: A | Discharge status: Home / Self Care | Payer: Medicaid Other | Attending: Emergency Medicine | Admitting: Emergency Medicine

## 2015-05-26 ENCOUNTER — Encounter (HOSPITAL_COMMUNITY): Payer: Self-pay | Admitting: Family Medicine

## 2015-05-26 DIAGNOSIS — J45909 Unspecified asthma, uncomplicated: Secondary | ICD-10-CM | POA: Diagnosis not present

## 2015-05-26 DIAGNOSIS — R1030 Lower abdominal pain, unspecified: Secondary | ICD-10-CM | POA: Diagnosis present

## 2015-05-26 LAB — COMPREHENSIVE METABOLIC PANEL
ALK PHOS: 57 U/L (ref 38–126)
ALT: 17 U/L (ref 17–63)
AST: 18 U/L (ref 15–41)
Albumin: 3.4 g/dL — ABNORMAL LOW (ref 3.5–5.0)
Anion gap: 9 (ref 5–15)
BUN: 10 mg/dL (ref 6–20)
CALCIUM: 9.1 mg/dL (ref 8.9–10.3)
CO2: 25 mmol/L (ref 22–32)
CREATININE: 1.05 mg/dL (ref 0.61–1.24)
Chloride: 101 mmol/L (ref 101–111)
Glucose, Bld: 137 mg/dL — ABNORMAL HIGH (ref 65–99)
Potassium: 3.8 mmol/L (ref 3.5–5.1)
Sodium: 135 mmol/L (ref 135–145)
Total Bilirubin: 1.1 mg/dL (ref 0.3–1.2)
Total Protein: 6.7 g/dL (ref 6.5–8.1)

## 2015-05-26 LAB — CBC
HCT: 39 % (ref 39.0–52.0)
Hemoglobin: 13.2 g/dL (ref 13.0–17.0)
MCH: 28.3 pg (ref 26.0–34.0)
MCHC: 33.8 g/dL (ref 30.0–36.0)
MCV: 83.5 fL (ref 78.0–100.0)
PLATELETS: 251 10*3/uL (ref 150–400)
RBC: 4.67 MIL/uL (ref 4.22–5.81)
RDW: 11.9 % (ref 11.5–15.5)
WBC: 19.7 10*3/uL — AB (ref 4.0–10.5)

## 2015-05-26 LAB — LIPASE, BLOOD: Lipase: 20 U/L (ref 11–51)

## 2015-05-26 NOTE — ED Notes (Signed)
Pt told staff he is leaving ED, staff saw him exit with friends

## 2015-05-26 NOTE — ED Notes (Signed)
Pt here unable to have a BM. sts nausea and pain in lower abdomen. Denies V,D.

## 2015-05-26 NOTE — ED Notes (Signed)
Patient unable to give urine sample at this time

## 2015-05-27 ENCOUNTER — Emergency Department (HOSPITAL_COMMUNITY)
Admission: EM | Admit: 2015-05-27 | Discharge: 2015-05-27 | Disposition: A | Discharge status: Home / Self Care | Payer: Medicaid Other | Attending: Emergency Medicine | Admitting: Emergency Medicine

## 2015-05-27 ENCOUNTER — Encounter (HOSPITAL_COMMUNITY): Payer: Self-pay | Admitting: Emergency Medicine

## 2015-05-27 ENCOUNTER — Emergency Department (HOSPITAL_COMMUNITY): Payer: Medicaid Other

## 2015-05-27 DIAGNOSIS — R300 Dysuria: Secondary | ICD-10-CM | POA: Insufficient documentation

## 2015-05-27 DIAGNOSIS — Z7951 Long term (current) use of inhaled steroids: Secondary | ICD-10-CM | POA: Insufficient documentation

## 2015-05-27 DIAGNOSIS — N5089 Other specified disorders of the male genital organs: Secondary | ICD-10-CM | POA: Diagnosis not present

## 2015-05-27 DIAGNOSIS — J45909 Unspecified asthma, uncomplicated: Secondary | ICD-10-CM | POA: Insufficient documentation

## 2015-05-27 DIAGNOSIS — Z79899 Other long term (current) drug therapy: Secondary | ICD-10-CM | POA: Insufficient documentation

## 2015-05-27 DIAGNOSIS — N50812 Left testicular pain: Secondary | ICD-10-CM | POA: Diagnosis present

## 2015-05-27 LAB — CBC WITH DIFFERENTIAL/PLATELET
BASOS ABS: 0 10*3/uL (ref 0.0–0.1)
Basophils Relative: 0 %
EOS ABS: 0 10*3/uL (ref 0.0–0.7)
EOS PCT: 0 %
HCT: 37.5 % — ABNORMAL LOW (ref 39.0–52.0)
Hemoglobin: 12.6 g/dL — ABNORMAL LOW (ref 13.0–17.0)
LYMPHS ABS: 2.4 10*3/uL (ref 0.7–4.0)
LYMPHS PCT: 13 %
MCH: 27.6 pg (ref 26.0–34.0)
MCHC: 33.6 g/dL (ref 30.0–36.0)
MCV: 82.2 fL (ref 78.0–100.0)
MONO ABS: 1.5 10*3/uL — AB (ref 0.1–1.0)
Monocytes Relative: 8 %
Neutro Abs: 14.4 10*3/uL — ABNORMAL HIGH (ref 1.7–7.7)
Neutrophils Relative %: 79 %
PLATELETS: 262 10*3/uL (ref 150–400)
RBC: 4.56 MIL/uL (ref 4.22–5.81)
RDW: 11.6 % (ref 11.5–15.5)
WBC: 18.4 10*3/uL — AB (ref 4.0–10.5)

## 2015-05-27 LAB — URINALYSIS, ROUTINE W REFLEX MICROSCOPIC
Bilirubin Urine: NEGATIVE
GLUCOSE, UA: NEGATIVE mg/dL
KETONES UR: NEGATIVE mg/dL
Nitrite: NEGATIVE
PROTEIN: 30 mg/dL — AB
Specific Gravity, Urine: 1.02 (ref 1.005–1.030)
pH: 7.5 (ref 5.0–8.0)

## 2015-05-27 LAB — BASIC METABOLIC PANEL
Anion gap: 10 (ref 5–15)
BUN: 10 mg/dL (ref 6–20)
CO2: 25 mmol/L (ref 22–32)
CREATININE: 1.02 mg/dL (ref 0.61–1.24)
Calcium: 8.9 mg/dL (ref 8.9–10.3)
Chloride: 102 mmol/L (ref 101–111)
GFR calc Af Amer: >60 mL/min (ref >60)
GLUCOSE: 97 mg/dL (ref 65–99)
POTASSIUM: 4.2 mmol/L (ref 3.5–5.1)
SODIUM: 137 mmol/L (ref 135–145)

## 2015-05-27 LAB — URINE MICROSCOPIC-ADD ON: SQUAMOUS EPITHELIAL / LPF: NONE SEEN

## 2015-05-27 MED ORDER — ACETAMINOPHEN 325 MG PO TABS
650.0000 mg | ORAL_TABLET | Freq: Once | ORAL | Status: AC
  Administered 2015-05-27: 650 mg via ORAL
  Filled 2015-05-27: qty 2

## 2015-05-27 MED ORDER — CIPROFLOXACIN HCL 500 MG PO TABS: 500.0000 mg | ORAL_TABLET | Freq: Two times a day (BID) | ORAL | Status: DC

## 2015-05-27 MED ORDER — CIPROFLOXACIN HCL 500 MG PO TABS
500.0000 mg | ORAL_TABLET | Freq: Once | ORAL | Status: AC
  Administered 2015-05-27: 500 mg via ORAL
  Filled 2015-05-27: qty 1

## 2015-05-27 NOTE — Discharge Instructions (Signed)
Please take all of your antibiotics until finished!  Take tylenol and/or ibuprofen as needed for fever.  Rest, scrotal support, ice as needed. Please follow up with your primary doctor in 3 days for discussion of your diagnoses and further evaluation after today's visit; if you do not have a primary care doctor use the resource guide provided to find one; Please return to the ER for new or worsening symptoms, any additional concerns.

## 2015-05-27 NOTE — ED Provider Notes (Signed)
CSN: [id number]     Arrival date & time 05/27/15  1755 History   First MD Initiated Contact with Patient 05/27/15 1832     Chief Complaint  Patient presents with  . Testicle Pain     (Consider location/radiation/quality/duration/timing/severity/associated sxs/prior Treatment) Patient is a 25 y.o. male presenting with testicular pain. The history is provided by the patient and medical records. No language interpreter was used.  Testicle Pain Associated symptoms include a fever. Pertinent negatives include no abdominal pain, congestion, coughing, headaches, nausea, neck pain, rash, sore throat, vomiting or weakness.   Kevontae Kathan is a 25 y.o. male  with a PMH of asthma who presents to the Emergency Department complaining of acute onset of left testicular pain associated with swelling which began this morning. Associated symptoms include fever, moderate amount of penile bleeding and dysuria. Denies abdominal pain, nausea, vomiting. No medications taken PTA. No recent injury, trauma, or heavy lifting.  Past Medical History  Diagnosis Date  . Asthma    History reviewed. No pertinent past surgical history. History reviewed. No pertinent family history. Social History  Substance Use Topics  . Smoking status: Never Smoker   . Smokeless tobacco: None  . Alcohol Use: No    Review of Systems  Constitutional: Positive for fever.  HENT: Negative for congestion and sore throat.   Eyes: Negative for visual disturbance.  Respiratory: Negative for cough, shortness of breath and wheezing.   Cardiovascular: Negative.   Gastrointestinal: Negative for nausea, vomiting and abdominal pain.  Genitourinary: Positive for discharge, scrotal swelling and testicular pain. Negative for penile swelling and penile pain.  Musculoskeletal: Negative for back pain and neck pain.  Skin: Negative for rash.  Neurological: Negative for dizziness, weakness and headaches.      Allergies  Review of patient's  allergies indicates no known allergies.  Home Medications   Prior to Admission medications   Medication Sig Start Date End Date Taking? Authorizing Provider  albuterol (PROVENTIL HFA;VENTOLIN HFA) 108 (90 BASE) MCG/ACT inhaler Inhale 2 puffs into the lungs every 4 (four) hours as needed for wheezing or shortness of breath. 02/16/15   Tammy S Parrett, NP  albuterol (PROVENTIL) (2.5 MG/3ML) 0.083% nebulizer solution Take 3 mLs (2.5 mg total) by nebulization every 6 (six) hours as needed for wheezing or shortness of breath. 01/16/15   Tammy S Parrett, NP  budesonide-formoterol (SYMBICORT) 160-4.5 MCG/ACT inhaler Inhale 2 puffs into the lungs 2 (two) times daily. 01/16/15   Tammy S Parrett, NP  loratadine (CLARITIN) 10 MG tablet Take 1 tablet (10 mg total) by mouth daily. 01/16/15   Tammy S Parrett, NP  montelukast (SINGULAIR) 10 MG tablet Take 1 tablet (10 mg total) by mouth at bedtime. 01/16/15   Tammy S Parrett, NP   BP 113/69 mmHg  Pulse 87  Temp(Src) 100.6 F (38.1 C) (Oral)  Resp 16  SpO2 100% Physical Exam  Constitutional: He is oriented to person, place, and time. He appears well-developed and well-nourished.  Alert and in no acute distress  HENT:  Head: Normocephalic and atraumatic.  Cardiovascular: Normal rate, regular rhythm and normal heart sounds.  Exam reveals no gallop and no friction rub.   No murmur heard. Pulmonary/Chest: Effort normal and breath sounds normal. No respiratory distress.  Abdominal: Soft. Bowel sounds are normal. He exhibits no distension and no mass. There is no tenderness. There is no rebound and no guarding.  Genitourinary: Cremasteric reflex is present.  Chaperone present for exam No signs of lesion or erythema  on the penis or testicles. The penis is nontender. No signs of any discharge from the penis. + swelling and tenderness of left testicle. + high riding left testicle.  Neurological: He is alert and oriented to person, place, and time.  Skin: Skin is  warm and dry. No rash noted.  Psychiatric: He has a normal mood and affect. His behavior is normal. Judgment and thought content normal.  Nursing note and vitals reviewed.   ED Course  Procedures (including critical care time) Labs Review Labs Reviewed  URINE CULTURE  BASIC METABOLIC PANEL  CBC WITH DIFFERENTIAL/PLATELET  URINALYSIS, ROUTINE W REFLEX MICROSCOPIC (NOT AT [facility])    Imaging Review No results found. I have personally reviewed and evaluated these images and lab results as part of my medical decision-making.   EKG Interpretation None      MDM   Final diagnoses:  None   Arvon Maraj presents with acute onset of left testicular pain and swelling with associated fever.   Labs: CBC with white count of 18.4, ab neutro of 14.4; BMP wdl UA with 6-30 WBC and trace leuks. -- urine sent for cx and G&C.  Imaging: Ultrasound shows no evidence of torsion and epididymis (R&L) of normal size and appearance.   Fever was controlled with tylenol while in ED.   Will treat with cipro, patient informed to continue tylenol and/or ibuprofen for fever control. PCP follow up. Return precautions given.   Patient discussed with Dr. Freida Busman who agrees with treatment plan.   [facility] Ward, PA-C 05/27/15 2239  Lorre Nick, MD 05/30/15 [phone]

## 2015-05-27 NOTE — ED Notes (Signed)
Writer informed pt that urine is needed for sample, pt states he has been requesting about something to drink but no one has brought him anything, Clinical research associate provided pt with a cup of ginger ale for urine sample

## 2015-05-27 NOTE — ED Notes (Addendum)
Pt reported lt testicular swollen and painful upon awaken, moderate amt of bright red bloody drainage, dysuria, lower abd pain, urinary frequency/urgency, hematuria, pt reported fever and denies heavy lifting

## 2015-05-29 LAB — URINE CULTURE

## 2015-06-08 ENCOUNTER — Ambulatory Visit (INDEPENDENT_AMBULATORY_CARE_PROVIDER_SITE_OTHER): Payer: Medicaid Other | Admitting: Pulmonary Disease

## 2015-06-08 ENCOUNTER — Encounter: Payer: Self-pay | Admitting: Pulmonary Disease

## 2015-06-08 ENCOUNTER — Other Ambulatory Visit: Payer: Medicaid Other

## 2015-06-08 VITALS — BP 114/74 | HR 87 | Ht 69.0 in | Wt 151.4 lb

## 2015-06-08 DIAGNOSIS — J4550 Severe persistent asthma, uncomplicated: Secondary | ICD-10-CM | POA: Diagnosis not present

## 2015-06-08 MED ORDER — LORATADINE 10 MG PO TABS: 10.0000 mg | ORAL_TABLET | Freq: Every day | ORAL | Status: DC

## 2015-06-08 MED ORDER — BUDESONIDE-FORMOTEROL FUMARATE 160-4.5 MCG/ACT IN AERO: 2.0000 | INHALATION_SPRAY | Freq: Two times a day (BID) | RESPIRATORY_TRACT | Status: DC

## 2015-06-08 MED ORDER — MONTELUKAST SODIUM 10 MG PO TABS: 10.0000 mg | ORAL_TABLET | Freq: Every day | ORAL | Status: DC

## 2015-06-08 MED ORDER — ALBUTEROL SULFATE HFA 108 (90 BASE) MCG/ACT IN AERS: 2.0000 | INHALATION_SPRAY | RESPIRATORY_TRACT | Status: DC | PRN

## 2015-06-08 MED ORDER — ALBUTEROL SULFATE (2.5 MG/3ML) 0.083% IN NEBU: 2.5000 mg | INHALATION_SOLUTION | Freq: Four times a day (QID) | RESPIRATORY_TRACT | Status: DC | PRN

## 2015-06-08 NOTE — Progress Notes (Signed)
   Subjective:    Patient ID: Jeremy Molina, male    DOB: January 11, 1991, 25 y.o.   MRN: [id number]  PROBLEM LIST Severe persistent asthma Multiple exacerbations. Intubated x 3.  HPI Mr. Elahi is here for follow-up of his asthma. He has severe persistent asthma with multiple exacerbations. He's had a diagnosis of asthma since childhood. He has been intubated 3 times, most recently in September 2016 for status asthmaticus. He had been maintained on albuterol prior to this and no controller medications. He was started on Symbicort last year after the admission and has been well controlled since then with rare use of albuterol inhaler. He has not had any exacerbations or ER visits for asthma. He was seen in ED on 05/27/15 with testicular pain and fevers.   DATA: CBC 05/27/15 WBC 18.4, Eosinophils 0%  CXR 12/24/14 No acute cardiopulmonary disease. Images reviewed.  PFTs 02/16/15 FVC 4.24 [104%) FEV1 3.50 (89%) F/F 67 TLC 92% DLCO 101% Mild obstructive disease. No bronchodilator response.  Social History: He is a never smoker. No alcohol or illegal drug use. He is not currently working.  Family history: Asthma- Grandparents.  Past Medical History  Diagnosis Date  . Asthma     Current outpatient prescriptions:  .  albuterol (PROVENTIL HFA;VENTOLIN HFA) 108 (90 Base) MCG/ACT inhaler, Inhale 2 puffs into the lungs every 4 (four) hours as needed for wheezing or shortness of breath., Disp: 1 Inhaler, Rfl: 5 .  albuterol (PROVENTIL) (2.5 MG/3ML) 0.083% nebulizer solution, Take 3 mLs (2.5 mg total) by nebulization every 6 (six) hours as needed for wheezing or shortness of breath. Dx: J45.50, Disp: 75 mL, Rfl: 5 .  budesonide-formoterol (SYMBICORT) 160-4.5 MCG/ACT inhaler, Inhale 2 puffs into the lungs 2 (two) times daily., Disp: 1 Inhaler, Rfl: 5 .  loratadine (CLARITIN) 10 MG tablet, Take 1 tablet (10 mg total) by mouth daily., Disp: 30 tablet, Rfl: 5 .  montelukast (SINGULAIR) 10 MG tablet,  Take 1 tablet (10 mg total) by mouth at bedtime., Disp: 30 tablet, Rfl: 5   Review of Systems denies any cough, dyspnea, wheezing, hemoptysis, sputum production. Denies any chest pain, palpitation. Denies any fevers, chills, nausea, vomiting, diarrhea, constipation. All other review of systems are negative     Objective:   Physical Exam Blood pressure 114/74, pulse 87, height  (1.753 m), weight 151 lb 6.4 oz (68.675 kg), SpO2 98 %.  Gen: No apparent distress Neuro: No gross focal deficits. Neck: No JVD, lymphadenopathy, thyromegaly. RS: Clear, no wheeze or crackles. CVS: S1-S2 heard, no murmurs rubs gallops. Abdomen: Soft, positive bowel sounds. Extremities: No edema.    Assessment & Plan:  Asthma He is doing well after his last admission in September 2016. He is now on a controller medication. PFTs show only mild obstructive disease with no BD response. He does not have evidence of eosinophils in blood. I will get IgE levels today as a baseline assessment.  Plan: - Continue symbicort, claritin and singulair. - Check IgE levels.  Retune in 6 months.   Chilton Greathouse MD  Pulmonary and Critical Care Pager [phone] If no answer or after 3pm call: [phone] 06/08/2015, 9:28 AM

## 2015-06-08 NOTE — Patient Instructions (Signed)
Continue using the Symbicort, loratadine, Singulair as prescribed. We will renew these medications. We will send you to lab for checking IgE levels  Return to clinic in 6 months.

## 2015-06-11 LAB — IGE: IGE (IMMUNOGLOBULIN E), SERUM: 360 kU/L — AB (ref <115)

## 2016-02-21 ENCOUNTER — Encounter (HOSPITAL_COMMUNITY): Payer: Self-pay

## 2016-02-21 ENCOUNTER — Emergency Department (HOSPITAL_COMMUNITY)
Admission: EM | Admit: 2016-02-21 | Discharge: 2016-02-21 | Disposition: A | Discharge status: Home / Self Care | Payer: Medicare Other | Attending: Emergency Medicine | Admitting: Emergency Medicine

## 2016-02-21 DIAGNOSIS — J45909 Unspecified asthma, uncomplicated: Secondary | ICD-10-CM | POA: Diagnosis present

## 2016-02-21 DIAGNOSIS — J4521 Mild intermittent asthma with (acute) exacerbation: Secondary | ICD-10-CM | POA: Diagnosis not present

## 2016-02-21 MED ORDER — ALBUTEROL SULFATE (2.5 MG/3ML) 0.083% IN NEBU
5.0000 mg | INHALATION_SOLUTION | Freq: Once | RESPIRATORY_TRACT | Status: AC
  Administered 2016-02-21: 5 mg via RESPIRATORY_TRACT
  Filled 2016-02-21: qty 6

## 2016-02-21 MED ORDER — ALBUTEROL SULFATE HFA 108 (90 BASE) MCG/ACT IN AERS
2.0000 | INHALATION_SPRAY | Freq: Once | RESPIRATORY_TRACT | Status: AC
  Administered 2016-02-21: 2 via RESPIRATORY_TRACT
  Filled 2016-02-21: qty 6.7

## 2016-02-21 MED ORDER — IPRATROPIUM BROMIDE 0.02 % IN SOLN
0.5000 mg | Freq: Once | RESPIRATORY_TRACT | Status: AC
  Administered 2016-02-21: 0.5 mg via RESPIRATORY_TRACT
  Filled 2016-02-21: qty 2.5

## 2016-02-21 MED ORDER — OXYMETAZOLINE HCL 0.05 % NA SOLN
1.0000 | Freq: Once | NASAL | Status: AC
  Administered 2016-02-21: 1 via NASAL
  Filled 2016-02-21: qty 15

## 2016-02-21 MED ORDER — LORATADINE 10 MG PO TABS
10.0000 mg | ORAL_TABLET | Freq: Once | ORAL | Status: AC
  Administered 2016-02-21: 10 mg via ORAL
  Filled 2016-02-21: qty 1

## 2016-02-21 MED ORDER — MONTELUKAST SODIUM 10 MG PO TABS: 10.0000 mg | ORAL_TABLET | Freq: Every day | ORAL | 0 refills | Status: DC

## 2016-02-21 MED ORDER — MAGNESIUM SULFATE 2 GM/50ML IV SOLN
2.0000 g | INTRAVENOUS | Status: AC
  Administered 2016-02-21: 2 g via INTRAVENOUS
  Filled 2016-02-21: qty 50

## 2016-02-21 MED ORDER — PREDNISONE 20 MG PO TABS: 40.0000 mg | ORAL_TABLET | Freq: Every day | ORAL | 0 refills | Status: DC

## 2016-02-21 MED ORDER — LORATADINE 10 MG PO TABS: 10.0000 mg | ORAL_TABLET | Freq: Every day | ORAL | 0 refills | Status: DC

## 2016-02-21 MED ORDER — BUDESONIDE-FORMOTEROL FUMARATE 160-4.5 MCG/ACT IN AERO: 2.0000 | INHALATION_SPRAY | Freq: Two times a day (BID) | RESPIRATORY_TRACT | 0 refills | Status: DC

## 2016-02-21 NOTE — ED Notes (Signed)
Ambulated without assistance sats 97-100 %

## 2016-02-21 NOTE — ED Provider Notes (Signed)
[facility]-EMERGENCY DEPT Provider Note   CSN: [id number] Arrival date & time: 02/21/16  2046     History   Chief Complaint Chief Complaint  Patient presents with  . Asthma    HPI Jeremy Molina is a 25 y.o. male.  25 year old male with a history of asthma presents to the emergency department for shortness of breath and chest tightness which began yesterday. He reports worsening of his symptoms 1 hour prior to arrival. Patient reports running out of his inhaler one week ago. Denies taking any medications prior to arrival of EMS to try and alleviate his chest tightness or shortness of breath. Patient was given 5 mg of albuterol as well as 125 mg of IV Solu-Medrol en route. Patient states that this provided him some relief. He denies any recent fevers. He has had some nasal congestion. No nausea or vomiting. No sick contacts. Patient does have a history of one recent intubation and ICU admission x 4 days for asthma exacerbation.     Past Medical History:  Diagnosis Date  . Asthma     Patient Active Problem List   Diagnosis Date Noted  . Asthma, chronic 01/16/2015  . Status asthmaticus 12/24/2014    History reviewed. No pertinent surgical history.     Home Medications    Prior to Admission medications   Medication Sig Start Date End Date Taking? Authorizing Provider  albuterol (PROVENTIL HFA;VENTOLIN HFA) 108 (90 Base) MCG/ACT inhaler Inhale 2 puffs into the lungs every 4 (four) hours as needed for wheezing or shortness of breath. 06/08/15  Yes Praveen Mannam, MD  albuterol (PROVENTIL) (2.5 MG/3ML) 0.083% nebulizer solution Take 3 mLs (2.5 mg total) by nebulization every 6 (six) hours as needed for wheezing or shortness of breath. Dx: J45.50 06/08/15  Yes Praveen Mannam, MD  budesonide-formoterol (SYMBICORT) 160-4.5 MCG/ACT inhaler Inhale 2 puffs into the lungs 2 (two) times daily. 02/21/16   Antony MaduraKelly Brean Carberry, PA-C  loratadine (CLARITIN) 10 MG tablet Take 1 tablet (10 mg total) by mouth  daily. 02/21/16   Antony MaduraKelly Amritha Yorke, PA-C  montelukast (SINGULAIR) 10 MG tablet Take 1 tablet (10 mg total) by mouth at bedtime. 02/21/16   Antony MaduraKelly Inaki Vantine, PA-C  predniSONE (DELTASONE) 20 MG tablet Take 2 tablets (40 mg total) by mouth daily. 02/21/16   Antony MaduraKelly Estil Vallee, PA-C    Family History History reviewed. No pertinent family history.  Social History Social History  Substance Use Topics  . Smoking status: Never Smoker  . Smokeless tobacco: Not on file  . Alcohol use No     Allergies   Patient has no known allergies.   Review of Systems Review of Systems Ten systems reviewed and are negative for acute change, except as noted in the HPI.    Physical Exam Updated Vital Signs BP 116/76   Pulse 109   Temp 98.7 F (37.1 C)   Resp 22   Ht 5\' 9"  (1.753 m)   Wt 65.8 kg   SpO2 96%   BMI 21.41 kg/m   Physical Exam  Constitutional: He is oriented to person, place, and time. He appears well-developed and well-nourished. No distress.  Nontoxic appearing and in no distress  HENT:  Head: Normocephalic and atraumatic.  Audible nasal congestion  Eyes: Conjunctivae and EOM are normal. No scleral icterus.  Neck: Normal range of motion.  Cardiovascular: Normal rate, regular rhythm and intact distal pulses.   Pulmonary/Chest: Effort normal. No respiratory distress. He has wheezes.  No tachypnea or dyspnea. Patient on 2  L nasal cannula for comfort and well, at 98%. He has mild expiratory wheezing diffusely. Lung sounds are otherwise clear. Chest expansion symmetric.  Musculoskeletal: Normal range of motion.  Neurological: He is alert and oriented to person, place, and time. He exhibits normal muscle tone. Coordination normal.  Skin: Skin is warm and dry. No rash noted. He is not diaphoretic. No erythema. No pallor.  Psychiatric: He has a normal mood and affect. His behavior is normal.  Nursing note and vitals reviewed.    ED Treatments / Results  Labs (all labs ordered are listed, but only  abnormal results are displayed) Labs Reviewed - No data to display  EKG  EKG Interpretation None       Radiology No results found.  Procedures Procedures (including critical care time)  Medications Ordered in ED Medications  albuterol (PROVENTIL) (2.5 MG/3ML) 0.083% nebulizer solution 5 mg (5 mg Nebulization Given 02/21/16 2107)  ipratropium (ATROVENT) nebulizer solution 0.5 mg (0.5 mg Nebulization Given 02/21/16 2107)  magnesium sulfate IVPB 2 g 50 mL (0 g Intravenous Stopped 02/21/16 2322)  loratadine (CLARITIN) tablet 10 mg (10 mg Oral Given 02/21/16 2134)  oxymetazoline (AFRIN) 0.05 % nasal spray 1 spray (1 spray Each Nare Given 02/21/16 2134)  albuterol (PROVENTIL HFA;VENTOLIN HFA) 108 (90 Base) MCG/ACT inhaler 2 puff (2 puffs Inhalation Given 02/21/16 2320)     Initial Impression / Assessment and Plan / ED Course  I have reviewed the triage vital signs and the nursing notes.  Pertinent labs & imaging results that were available during my care of the patient were reviewed by me and considered in my medical decision making (see chart for details).  Clinical Course     10:00 PM Patient reassessed. He states that he feels improved after DuoNeb. Magnesium infusing. Patient in no distress, playing on cell phone. Lung sounds clear. Will assess pulse ox with ambulation and monitor for signs of rebound.  11:05 PM Patient without signs of rebound. He is ambulatory in the ED with SpO2 of 97-100% on room air. No c/o SOB. Patient to be started on prednisone burst. Inhaler given prior to discharge As well as prescriptions for Symbicort and Singulair. Primary care follow-up advised and return precautions given. Patient discharged in stable condition with no unaddressed concerns.   Final Clinical Impressions(s) / ED Diagnoses   Final diagnoses:  Mild intermittent asthma with exacerbation    New Prescriptions Discharge Medication List as of 02/21/2016 10:57 PM    START taking these  medications   Details  predniSONE (DELTASONE) 20 MG tablet Take 2 tablets (40 mg total) by mouth daily., Starting Thu 02/21/2016, Print         [employer] Saxon Barich, PA-C 02/21/16 [id number]    Doug SouSam Jacubowitz, MD 02/22/16 [id number]

## 2016-02-21 NOTE — ED Triage Notes (Addendum)
Patient here for asthma, sob and chest tightness started 1 hour ago. Pt has been out of inhaler 1 week and typically uses it every day. Pt was given albuterol 5 and solumedrol 125 by ems

## 2016-02-21 NOTE — Discharge Instructions (Signed)
Take prednisone as prescribed as well as your other daily medications. Use an albuterol inhaler, 2 puffs every 4-6 hours, as needed. Follow-up with your primary care doctor.

## 2016-03-31 ENCOUNTER — Encounter (HOSPITAL_COMMUNITY): Payer: Self-pay | Admitting: Emergency Medicine

## 2016-03-31 ENCOUNTER — Emergency Department (HOSPITAL_COMMUNITY): Payer: Medicare Other

## 2016-03-31 ENCOUNTER — Emergency Department (HOSPITAL_COMMUNITY)
Admission: EM | Admit: 2016-03-31 | Discharge: 2016-03-31 | Disposition: A | Discharge status: Home / Self Care | Payer: Medicare Other | Attending: Emergency Medicine | Admitting: Emergency Medicine

## 2016-03-31 DIAGNOSIS — R112 Nausea with vomiting, unspecified: Secondary | ICD-10-CM | POA: Insufficient documentation

## 2016-03-31 DIAGNOSIS — R0602 Shortness of breath: Secondary | ICD-10-CM | POA: Diagnosis present

## 2016-03-31 DIAGNOSIS — J45901 Unspecified asthma with (acute) exacerbation: Secondary | ICD-10-CM | POA: Diagnosis not present

## 2016-03-31 DIAGNOSIS — R197 Diarrhea, unspecified: Secondary | ICD-10-CM | POA: Insufficient documentation

## 2016-03-31 MED ORDER — AEROCHAMBER PLUS W/MASK MISC
1.0000 | Freq: Once | Status: AC
  Administered 2016-03-31: 1
  Filled 2016-03-31: qty 1

## 2016-03-31 MED ORDER — PREDNISONE 10 MG PO TABS: 20.0000 mg | ORAL_TABLET | Freq: Every day | ORAL | 0 refills | Status: DC

## 2016-03-31 MED ORDER — ALBUTEROL SULFATE HFA 108 (90 BASE) MCG/ACT IN AERS
2.0000 | INHALATION_SPRAY | RESPIRATORY_TRACT | Status: DC | PRN
  Administered 2016-03-31: 2 via RESPIRATORY_TRACT
  Filled 2016-03-31: qty 6.7

## 2016-03-31 MED ORDER — PREDNISONE 20 MG PO TABS
60.0000 mg | ORAL_TABLET | Freq: Once | ORAL | Status: AC
  Administered 2016-03-31: 60 mg via ORAL
  Filled 2016-03-31: qty 3

## 2016-03-31 MED ORDER — BUDESONIDE-FORMOTEROL FUMARATE 160-4.5 MCG/ACT IN AERO: 2.0000 | INHALATION_SPRAY | Freq: Two times a day (BID) | RESPIRATORY_TRACT | 0 refills | Status: DC

## 2016-03-31 NOTE — ED Notes (Signed)
Pt states he has been out of his inhalers x 2 days

## 2016-03-31 NOTE — ED Notes (Signed)
Patient provided bus pass per request

## 2016-03-31 NOTE — ED Notes (Signed)
ED Provider at bedside. 

## 2016-03-31 NOTE — Discharge Instructions (Signed)
Please take all medications as prescribed.  Recheck with your primary care doctor this week.

## 2016-03-31 NOTE — ED Triage Notes (Signed)
Pt transported from home with c/o shortnes of breath, fever, n/v/d, productive cough. Pt does have hx of asthma. Wheezing noted by EMS. IV est, Zofran 4mg  IVP, neb Albuterol 10mg  and Atrovent .5 given. A & O.

## 2016-03-31 NOTE — ED Provider Notes (Signed)
[facility]EMERGENCY DEPT Provider Note   CSN: [id number] Arrival date & time: 03/31/16  0551     History   Chief Complaint Chief Complaint  Patient presents with  . Shortness of Breath    HPI Lj Colbert Coyerrrieta is a 25 y.o. male.  HPI Toni ArthursRahim is a 25 year old male with a history of asthma status post intubation last month who presents today with nausea, vomiting, diarrhea, cough, and dyspnea. He has been out of his inhaler. His symptoms began last night. He vomited 3 times. There was no blood or bile. He had 3 loose stools.Marland Kitchen. He felt like he had some chills but did not have a fever. No one else around him has been ill recently. He was out of his inhaler and came to the ED for some cough. He was given an albuterol nebulizer prior to my valuation and feels that this has resulted symptoms. He also received Zofran and has had no further vomiting. He has not received a flu shot. He is not a smoker. Past Medical History:  Diagnosis Date  . Asthma     Patient Active Problem List   Diagnosis Date Noted  . Asthma, chronic 01/16/2015  . Status asthmaticus 12/24/2014    History reviewed. No pertinent surgical history.     Home Medications    Prior to Admission medications   Medication Sig Start Date End Date Taking? Authorizing Provider  albuterol (PROVENTIL HFA;VENTOLIN HFA) 108 (90 Base) MCG/ACT inhaler Inhale 2 puffs into the lungs every 4 (four) hours as needed for wheezing or shortness of breath. 06/08/15   Praveen Mannam, MD  albuterol (PROVENTIL) (2.5 MG/3ML) 0.083% nebulizer solution Take 3 mLs (2.5 mg total) by nebulization every 6 (six) hours as needed for wheezing or shortness of breath. Dx: J45.50 06/08/15   Chilton GreathousePraveen Mannam, MD  budesonide-formoterol (SYMBICORT) 160-4.5 MCG/ACT inhaler Inhale 2 puffs into the lungs 2 (two) times daily. 02/21/16   Antony MaduraKelly Humes, PA-C  loratadine (CLARITIN) 10 MG tablet Take 1 tablet (10 mg total) by mouth daily. 02/21/16   Antony MaduraKelly Humes, PA-C  montelukast  (SINGULAIR) 10 MG tablet Take 1 tablet (10 mg total) by mouth at bedtime. 02/21/16   Antony MaduraKelly Humes, PA-C  predniSONE (DELTASONE) 20 MG tablet Take 2 tablets (40 mg total) by mouth daily. 02/21/16   Antony MaduraKelly Humes, PA-C    Family History No family history on file.  Social History Social History  Substance Use Topics  . Smoking status: Never Smoker  . Smokeless tobacco: Never Used  . Alcohol use No     Allergies   Patient has no known allergies.   Review of Systems Review of Systems  All other systems reviewed and are negative.    Physical Exam Updated Vital Signs BP 122/71 (BP Location: Right Arm)   Pulse 92   Temp 99 F (37.2 C) (Axillary)   Resp 16   SpO2 100%   Physical Exam  Constitutional: He is oriented to person, place, and time. He appears well-developed and well-nourished. No distress.  HENT:  Head: Normocephalic and atraumatic.  Right Ear: External ear normal.  Left Ear: External ear normal.  Nose: Nose normal.  Mouth/Throat: Oropharynx is clear and moist.  Eyes: EOM are normal. Pupils are equal, round, and reactive to light.  Conjunctivae injected bilaterally  Neck: Normal range of motion.  Cardiovascular: Normal rate and regular rhythm.   Pulmonary/Chest: Effort normal and breath sounds normal. No respiratory distress. He has no wheezes. He has no rales.  Abdominal: Soft. Bowel  sounds are normal.  Musculoskeletal: Normal range of motion.  Neurological: He is alert and oriented to person, place, and time. No cranial nerve deficit. Coordination normal.  Skin: Skin is warm and dry. Capillary refill takes less than 2 seconds.  Psychiatric: He has a normal mood and affect.  Nursing note and vitals reviewed.    ED Treatments / Results  Labs (all labs ordered are listed, but only abnormal results are displayed) Labs Reviewed - No data to display  EKG  EKG Interpretation None       Radiology Dg Chest 2 View  Result Date: 03/31/2016 CLINICAL DATA:   Cough and fever. EXAM: CHEST  2 VIEW COMPARISON:  12/26/2014. FINDINGS: Interim extubation and removal of NG tube. Lungs are clear. No pleural effusion or pneumothorax. Heart size normal. IMPRESSION: Interim extubation removal of NG tube. No acute cardiopulmonary disease. Electronically Signed   By: Maisie Fushomas  Register   On: 03/31/2016 07:04    Procedures Procedures (including critical care time)  Medications Ordered in ED Medications - No data to display   Initial Impression / Assessment and Plan / ED Course  I have reviewed the triage vital signs and the nursing notes.  Pertinent labs & imaging results that were available during my care of the patient were reviewed by me and considered in my medical decision making (see chart for details).  Clinical Course     He should not wheezing on my exam but he has a significant history of asthma. He is receiving an albuterol HFA, prednisone here in the department. His Symbicort will be refilled. We have discussed the need for return if he is worse anytime and need for follow-up. He states he has been seen in the family practice clinic before and can obtain follow-up there.  Final Clinical Impressions(s) / ED Diagnoses   Final diagnoses:  Moderate asthma with exacerbation, unspecified whether persistent  Nausea vomiting and diarrhea    New Prescriptions New Prescriptions   No medications on file     Margarita Grizzleanielle Robt Okuda, MD 03/31/16 [phone]

## 2016-04-01 ENCOUNTER — Observation Stay (HOSPITAL_COMMUNITY)
Admission: EM | Admit: 2016-04-01 | Discharge: 2016-04-02 | Disposition: A | Discharge status: Home / Self Care | Payer: Medicare Other | Attending: Internal Medicine | Admitting: Internal Medicine

## 2016-04-01 ENCOUNTER — Encounter (HOSPITAL_COMMUNITY): Payer: Self-pay | Admitting: Emergency Medicine

## 2016-04-01 ENCOUNTER — Emergency Department (HOSPITAL_COMMUNITY): Payer: Medicare Other

## 2016-04-01 DIAGNOSIS — R000 Tachycardia, unspecified: Secondary | ICD-10-CM

## 2016-04-01 DIAGNOSIS — Z79899 Other long term (current) drug therapy: Secondary | ICD-10-CM | POA: Diagnosis not present

## 2016-04-01 DIAGNOSIS — J4551 Severe persistent asthma with (acute) exacerbation: Principal | ICD-10-CM | POA: Insufficient documentation

## 2016-04-01 DIAGNOSIS — E860 Dehydration: Secondary | ICD-10-CM | POA: Diagnosis present

## 2016-04-01 DIAGNOSIS — J45901 Unspecified asthma with (acute) exacerbation: Secondary | ICD-10-CM | POA: Diagnosis present

## 2016-04-01 DIAGNOSIS — J9601 Acute respiratory failure with hypoxia: Secondary | ICD-10-CM | POA: Diagnosis present

## 2016-04-01 DIAGNOSIS — J45909 Unspecified asthma, uncomplicated: Secondary | ICD-10-CM | POA: Diagnosis present

## 2016-04-01 DIAGNOSIS — J4521 Mild intermittent asthma with (acute) exacerbation: Secondary | ICD-10-CM

## 2016-04-01 DIAGNOSIS — Z23 Encounter for immunization: Secondary | ICD-10-CM | POA: Insufficient documentation

## 2016-04-01 DIAGNOSIS — R0602 Shortness of breath: Secondary | ICD-10-CM | POA: Diagnosis present

## 2016-04-01 LAB — RESPIRATORY PANEL BY PCR
Adenovirus: NOT DETECTED
BORDETELLA PERTUSSIS-RVPCR: NOT DETECTED
CHLAMYDOPHILA PNEUMONIAE-RVPPCR: NOT DETECTED
Coronavirus 229E: NOT DETECTED
Coronavirus HKU1: NOT DETECTED
Coronavirus NL63: NOT DETECTED
Coronavirus OC43: NOT DETECTED
INFLUENZA A-RVPPCR: NOT DETECTED
Influenza B: NOT DETECTED
Metapneumovirus: NOT DETECTED
Mycoplasma pneumoniae: NOT DETECTED
PARAINFLUENZA VIRUS 3-RVPPCR: NOT DETECTED
PARAINFLUENZA VIRUS 4-RVPPCR: NOT DETECTED
Parainfluenza Virus 1: NOT DETECTED
Parainfluenza Virus 2: NOT DETECTED
RESPIRATORY SYNCYTIAL VIRUS-RVPPCR: NOT DETECTED
RHINOVIRUS / ENTEROVIRUS - RVPPCR: DETECTED — AB

## 2016-04-01 LAB — CBC WITH DIFFERENTIAL/PLATELET
Basophils Absolute: 0 10*3/uL (ref 0.0–0.1)
Basophils Relative: 0 %
EOS PCT: 2 %
Eosinophils Absolute: 0.4 10*3/uL (ref 0.0–0.7)
HCT: 38 % — ABNORMAL LOW (ref 39.0–52.0)
Hemoglobin: 12.9 g/dL — ABNORMAL LOW (ref 13.0–17.0)
LYMPHS ABS: 2.1 10*3/uL (ref 0.7–4.0)
LYMPHS PCT: 14 %
MCH: 28.2 pg (ref 26.0–34.0)
MCHC: 33.9 g/dL (ref 30.0–36.0)
MCV: 83 fL (ref 78.0–100.0)
Monocytes Absolute: 0.8 10*3/uL (ref 0.1–1.0)
Monocytes Relative: 5 %
Neutro Abs: 12.2 10*3/uL — ABNORMAL HIGH (ref 1.7–7.7)
Neutrophils Relative %: 79 %
PLATELETS: 240 10*3/uL (ref 150–400)
RBC: 4.58 MIL/uL (ref 4.22–5.81)
RDW: 13.2 % (ref 11.5–15.5)
WBC: 15.5 10*3/uL — AB (ref 4.0–10.5)

## 2016-04-01 LAB — BASIC METABOLIC PANEL
Anion gap: 9 (ref 5–15)
BUN: 13 mg/dL (ref 6–20)
CHLORIDE: 106 mmol/L (ref 101–111)
CO2: 25 mmol/L (ref 22–32)
Calcium: 9.1 mg/dL (ref 8.9–10.3)
Creatinine, Ser: 1.05 mg/dL (ref 0.61–1.24)
GFR calc Af Amer: >60 mL/min (ref >60)
GFR calc non Af Amer: >60 mL/min (ref >60)
GLUCOSE: 101 mg/dL — AB (ref 65–99)
POTASSIUM: 3.5 mmol/L (ref 3.5–5.1)
Sodium: 140 mmol/L (ref 135–145)

## 2016-04-01 LAB — MAGNESIUM: Magnesium: 2 mg/dL (ref 1.7–2.4)

## 2016-04-01 MED ORDER — METHYLPREDNISOLONE SODIUM SUCC 125 MG IJ SOLR
60.0000 mg | Freq: Four times a day (QID) | INTRAMUSCULAR | Status: DC
  Administered 2016-04-01 – 2016-04-02 (×6): 60 mg via INTRAVENOUS
  Filled 2016-04-01 (×4): qty 2

## 2016-04-01 MED ORDER — ENOXAPARIN SODIUM 40 MG/0.4ML ~~LOC~~ SOLN
40.0000 mg | SUBCUTANEOUS | Status: DC
  Administered 2016-04-01: 40 mg via SUBCUTANEOUS
  Filled 2016-04-01: qty 0.4

## 2016-04-01 MED ORDER — ALBUTEROL (5 MG/ML) CONTINUOUS INHALATION SOLN
15.0000 mg/h | INHALATION_SOLUTION | Freq: Once | RESPIRATORY_TRACT | Status: AC
  Administered 2016-04-01: 15 mg/h via RESPIRATORY_TRACT
  Filled 2016-04-01: qty 20

## 2016-04-01 MED ORDER — LORATADINE 10 MG PO TABS
10.0000 mg | ORAL_TABLET | Freq: Every day | ORAL | Status: DC
  Administered 2016-04-01 – 2016-04-02 (×2): 10 mg via ORAL
  Filled 2016-04-01 (×2): qty 1

## 2016-04-01 MED ORDER — ACETAMINOPHEN 650 MG RE SUPP: 650.0000 mg | Freq: Four times a day (QID) | RECTAL | Status: DC | PRN

## 2016-04-01 MED ORDER — POTASSIUM CHLORIDE IN NACL 20-0.9 MEQ/L-% IV SOLN
INTRAVENOUS | Status: DC
  Administered 2016-04-01: 19:00:00 via INTRAVENOUS
  Administered 2016-04-01: 125 mL/h via INTRAVENOUS
  Filled 2016-04-01 (×3): qty 1000

## 2016-04-01 MED ORDER — IPRATROPIUM-ALBUTEROL 0.5-2.5 (3) MG/3ML IN SOLN
3.0000 mL | Freq: Four times a day (QID) | RESPIRATORY_TRACT | Status: DC
  Administered 2016-04-01 – 2016-04-02 (×5): 3 mL via RESPIRATORY_TRACT
  Filled 2016-04-01 (×6): qty 3

## 2016-04-01 MED ORDER — ALBUTEROL SULFATE (2.5 MG/3ML) 0.083% IN NEBU
5.0000 mg | INHALATION_SOLUTION | Freq: Once | RESPIRATORY_TRACT | Status: AC
  Administered 2016-04-01: 5 mg via RESPIRATORY_TRACT
  Filled 2016-04-01: qty 6

## 2016-04-01 MED ORDER — ACETAMINOPHEN 325 MG PO TABS: 650.0000 mg | ORAL_TABLET | Freq: Four times a day (QID) | ORAL | Status: DC | PRN

## 2016-04-01 MED ORDER — MONTELUKAST SODIUM 10 MG PO TABS
10.0000 mg | ORAL_TABLET | Freq: Every day | ORAL | Status: DC
  Administered 2016-04-01: 10 mg via ORAL
  Filled 2016-04-01: qty 1

## 2016-04-01 MED ORDER — MAGNESIUM SULFATE 2 GM/50ML IV SOLN
2.0000 g | Freq: Once | INTRAVENOUS | Status: AC
  Administered 2016-04-01: 2 g via INTRAVENOUS
  Filled 2016-04-01: qty 50

## 2016-04-01 MED ORDER — MOMETASONE FURO-FORMOTEROL FUM 200-5 MCG/ACT IN AERO
2.0000 | INHALATION_SPRAY | Freq: Two times a day (BID) | RESPIRATORY_TRACT | Status: DC
Start: 2016-04-01 — End: 2016-04-02
  Administered 2016-04-01 – 2016-04-02 (×3): 2 via RESPIRATORY_TRACT
  Filled 2016-04-01 (×2): qty 8.8

## 2016-04-01 MED ORDER — INFLUENZA VAC SPLIT QUAD 0.5 ML IM SUSY
0.5000 mL | PREFILLED_SYRINGE | INTRAMUSCULAR | Status: AC
  Administered 2016-04-02: 0.5 mL via INTRAMUSCULAR
  Filled 2016-04-01: qty 0.5

## 2016-04-01 MED ORDER — LEVOFLOXACIN IN D5W 500 MG/100ML IV SOLN
500.0000 mg | INTRAVENOUS | Status: DC
  Administered 2016-04-01 – 2016-04-02 (×2): 500 mg via INTRAVENOUS
  Filled 2016-04-01 (×2): qty 100

## 2016-04-01 NOTE — ED Notes (Signed)
Dr. Merrell at bedside. 

## 2016-04-01 NOTE — Progress Notes (Signed)
Patient arrived on unit from ED. No family at bedside.  

## 2016-04-01 NOTE — ED Notes (Signed)
Pt eating lunch and tolerating well. 

## 2016-04-01 NOTE — H&P (Signed)
History and Physical    Jeremy Molina WUJ:[id number] DOB: 09-Jun-1990 DOA: 04/01/2016   [facility]   Patient coming from/Resides with: Private residence/lives with multiple roommates  Admission status: Observation/floor -it may be medically necessary to stay a minimum 2 midnights to rule out impending and/or unexpected changes in physiologic status that may differ from initial evaluation performed in the ER and/or at time of admission before please reconsider evaluation of admission status within 24 hrs.  Chief Complaint: Shortness of breath  HPI: Jeremy Molina is a 25 y.o. male with medical history significant for asthma. Evaluated in the ER on 12/18 for shortness of breath and wheezing. Was treated appropriately with nebulizers and steroids and discharged with recommendations to refill his Symbicort prescription. The patient reported he was unable to obtain his Symbicort and redeveloped shortness of breath symptoms. EMS was called to the home and patient was found to have O2 saturations of 88% on room air and was having difficulty speaking secondary to shortness of breath. He had diffuse inspiratory and expiratory wheezes. Was given continuous nebulizer 10 mg in the field with 1 mg of Atrovent and 125 mg of Solu-Medrol IV. The time he arrived to the ER he was slightly improved but still speaking in short sentences with diminished breath throughout all fields with tight expiratory wheezing. Additional DuoNeb was ordered. Chest x-ray without evidence of focal infiltrate. Oxygen was placed. In my discussion with the patient he reports being sick for 3 days with cough and productive yellow sputum and low-grade fevers. Denies sick contacts.  ED Course:  Vital Signs: BP 100/61   Pulse 110   Temp 98.3 F (36.8 C) (Oral)   Resp 16   Ht 5\' 10"  (1.778 m)   Wt 68 kg (150 lb)   SpO2 95%   BMI 21.52 kg/m   2 view chest x-ray: Mild left basilar subsegmental atelectasis otherwise no  acute process Lab data: Sodium 140, potassium 3.5, CO2 25, BUN 13, creatinine 1.5, glucose 101, anion gap 9, white count 15,500 with neutrophils 79% and absolute neutrophils 12.12%, hemoglobin 12.9, platelets 240,000 Medications and treatments: Proventil 5 mg nebulizer 1, Proventil continuous neb 15 mg/hour, magnesium 2 g IV 1  Review of Systems:  In addition to the HPI above,  No chills, myalgias or other constitutional symptoms No Headache, changes with Vision or hearing, new weakness, tingling, numbness in any extremity, dizziness, dysarthria or word finding difficulty, gait disturbance or imbalance, tremors or seizure activity No problems swallowing food or Liquids, indigestion/reflux, choking or coughing while eating, abdominal pain with or after eating No Chest pain, palpitations, orthopnea  No Abdominal pain, N/V, melena,hematochezia, dark tarry stools, constipation No dysuria, malodorous urine, hematuria or flank pain No new skin rashes, lesions, masses or bruises, No new joint pains, aches, swelling or redness No recent unintentional weight gain or loss No polyuria, polydypsia or polyphagia   Past Medical History:  Diagnosis Date  . Asthma     History reviewed. No pertinent surgical history.  Social History   Social History  . Marital status: Single    Spouse name: N/A  . Number of children: N/A  . Years of education: N/A   Occupational History  . Not on file.   Social History Main Topics  . Smoking status: Never Smoker  . Smokeless tobacco: Never Used  . Alcohol use No  . Drug use: No  . Sexual activity: Yes   Other Topics Concern  . Not on file  Social History Narrative  . 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 Authorizing Provider  albuterol (PROVENTIL HFA;VENTOLIN HFA) 108 (90 Base) MCG/ACT inhaler Inhale 2 puffs into the lungs every 4 (four) hours as needed for wheezing or shortness of breath. 06/08/15  Yes Praveen Mannam, MD  albuterol (PROVENTIL) (2.5 MG/3ML) 0.083% nebulizer solution Take 3 mLs (2.5 mg total) by nebulization every 6 (six) hours as needed for wheezing or shortness of breath. Dx: J45.50 06/08/15  Yes Praveen Mannam, MD  budesonide-formoterol (SYMBICORT) 160-4.5 MCG/ACT inhaler Inhale 2 puffs into the lungs 2 (two) times daily. 03/31/16  Yes Margarita Grizzleanielle Ray, MD  loratadine (CLARITIN) 10 MG tablet Take 1 tablet (10 mg total) by mouth daily. 02/21/16  Yes Antony MaduraKelly Humes, PA-C  montelukast (SINGULAIR) 10 MG tablet Take 1 tablet (10 mg total) by mouth at bedtime. 02/21/16  Yes Antony MaduraKelly Humes, PA-C  predniSONE (DELTASONE) 10 MG tablet Take 2 tablets (20 mg total) by mouth daily. Patient taking differently: Take 10 mg by mouth daily as needed (for asthmatic symptoms).  03/31/16  Yes Margarita Grizzleanielle Ray, MD    Physical Exam: Vitals:   04/01/16 0830 04/01/16 0845 04/01/16 0900 04/01/16 0915  BP: 112/61 112/56 109/61 100/61  Pulse: 107 114 116 110  Resp: 19 22 17 16   Temp:      TempSrc:      SpO2: 95% 96% 96% 95%  Weight:      Height:          Constitutional: NAD, calm, comfortable Eyes: PERRL, lids and conjunctivae normal ENMT: Mucous membranes are dry. Posterior pharynx clear of any exudate or lesions.Normal dentition.  Neck: normal, supple, no masses, no thyromegaly Respiratory: Good air movement bilaterally with coarse expiratory wheezing, Normal respiratory effort. No accessory muscle use. Nasal cannula oxygen Cardiovascular: Regular tachycardic rate and rhythm, no murmurs / rubs / gallops. No extremity edema. 2+ pedal pulses. No carotid bruits.  Abdomen: no tenderness, no masses palpated. No hepatosplenomegaly. Bowel sounds positive.  Musculoskeletal: no clubbing / cyanosis. No  joint deformity upper and lower extremities. Good ROM, no contractures. Normal muscle tone.  Skin: no rashes, lesions, ulcers. No induration Neurologic: CN 2-12 grossly intact. Sensation intact, DTR normal. Strength 5/5 x all 4 extremities.  Psychiatric: Normal judgment and insight. Alert and oriented x 3. Normal mood.    Labs on Admission: I have personally reviewed following labs and imaging studies  CBC:  Recent Labs Lab 04/01/16 0537  WBC 15.5*  NEUTROABS 12.2*  HGB 12.9*  HCT 38.0*  MCV 83.0  PLT 240   Basic Metabolic Panel:  Recent Labs Lab 04/01/16 0537  NA 140  K 3.5  CL 106  CO2 25  GLUCOSE 101*  BUN 13  CREATININE 1.05  CALCIUM 9.1   GFR: Estimated Creatinine Clearance: 102.5 mL/min (by C-G formula based on SCr of 1.05 mg/dL). Liver Function Tests: No results for input(s): AST, ALT, ALKPHOS, BILITOT, PROT, ALBUMIN in the last 168 hours. No results for input(s): LIPASE, AMYLASE in the last 168 hours. No results for input(s): AMMONIA in the last 168 hours. Coagulation Profile: No results for input(s): INR, PROTIME in the last 168 hours. Cardiac Enzymes: No results for input(s): CKTOTAL, CKMB, CKMBINDEX, TROPONINI in the last 168 hours.  BNP (last 3 results) No results for input(s): PROBNP in the last 8760 hours. HbA1C: No results for input(s): HGBA1C in the last 72 hours. CBG: No results for input(s): GLUCAP in the last 168 hours. Lipid Profile: No results for input(s): CHOL, HDL, LDLCALC, TRIG, CHOLHDL, LDLDIRECT in the last 72 hours. Thyroid Function Tests: No results for input(s): TSH, T4TOTAL, FREET4, T3FREE, THYROIDAB in the last 72 hours. Anemia Panel: No results for input(s): VITAMINB12, FOLATE, FERRITIN, TIBC, IRON, RETICCTPCT in the last 72 hours. Urine analysis:    Component Value Date/Time   COLORURINE YELLOW 05/27/2015 2037   APPEARANCEUR CLEAR 05/27/2015 2037   LABSPEC 1.020 05/27/2015 2037   PHURINE 7.5 05/27/2015 2037   GLUCOSEU  NEGATIVE 05/27/2015 2037   HGBUR TRACE (A) 05/27/2015 2037   BILIRUBINUR NEGATIVE 05/27/2015 2037   KETONESUR NEGATIVE 05/27/2015 2037   PROTEINUR 30 (A) 05/27/2015 2037   UROBILINOGEN 0.2 12/24/2014 2017   NITRITE NEGATIVE 05/27/2015 2037   LEUKOCYTESUR SMALL (A) 05/27/2015 2037   Sepsis Labs: @LABRCNTIP (procalcitonin:4,lacticidven:4) )No results found for this or any previous visit (from the past 240 hour(s)).   Radiological Exams on Admission: Dg Chest 2 View  Result Date: 04/01/2016 CLINICAL DATA:  Cough. EXAM: CHEST  2 VIEW COMPARISON:  03/31/2016 . FINDINGS: Mediastinum and hilar structures are normal. Heart size normal. Lungs are clear mild left base subsegmental atelectasis. Lungs are otherwise clear. No pleural effusion or pneumothorax. IMPRESSION: Mild left base subsegmental atelectasis otherwise negative exam. Electronically Signed   By: Maisie Fus  Register   On: 04/01/2016 06:21   Dg Chest 2 View  Result Date: 03/31/2016 CLINICAL DATA:  Cough and fever. EXAM: CHEST  2 VIEW COMPARISON:  12/26/2014. FINDINGS: Interim extubation and removal of NG tube. Lungs are clear. No pleural effusion or pneumothorax. Heart size normal. IMPRESSION: Interim extubation removal of NG tube. No acute cardiopulmonary disease. Electronically Signed   By: Maisie Fus  Register   On: 03/31/2016 07:04    EKG: (Independently reviewed) Sinus tachycardia with ventricular rate 111 bpm, QTC 431 ms, right atrial enlargement, prominent R waves and septal lateral leads, no ischemic changes  Assessment/Plan Principal Problem:   Acute asthma exacerbation with asthmatic bronchitis -Patient presents with 3 days of respiratory symptoms associated with mild fever and cough exacerbating his underlying asthma -Symptom management with steroids and nebulizers -Empiric Levaquin noting no focal infiltrate on chest x-ray -Incentive spirometry -Respiratory viral panel -Patient does have leukocytosis but this labwork was  obtained after being given steroids in multiple doses in the past 24 hours  Active Problems:   Acute respiratory failure with hypoxia  -Secondary to principal problem -Continue supportive care with oxygen and wean as tolerated    Dehydration, moderate -Patient admits to poor oral intake for 3 days -Became quite tachycardic with heart rates in the 130s after continuous nebulizer but with the addition of IV fluids and time-based allowance for sympathetic effects of nebulizer to diminish, heart rate has decreased into the low 100s -Potassium somewhat suboptimal so have initiated normal saline with 20 mEq potassium to run at 125 mL per hour -Follow electrolytes      DVT prophylaxis: Lovenox Code Status: Full Family Communication: No friends or family at bedside Disposition Plan: Anticipate discharge preadmission home environment Consults called: None     Baran Kuhrt L. ANP-BC [facility] Pager [phone]   If 7PM-7AM, please contact night-coverage [email or web address] Password [email or web address]  04/01/2016, 9:29 AM

## 2016-04-01 NOTE — ED Provider Notes (Signed)
[facility]-EMERGENCY DEPT Provider Note   CSN: [id number] Arrival date & time: 04/01/16  0509     History   Chief Complaint Chief Complaint  Patient presents with  . Shortness of Breath    HPI Jeremy Molina is a 25 y.o. male.  HPI  This is a 25 year old male who presents with shortness of breath. Was seen and evaluated yesterday for the same. Diagnosed with an asthma exacerbation. Patient reports worsening at home. States that his shortness of breath is not improved with albuterol. He is out of his Symbicort. On EMS evaluation he was 88% on room air and speaking in short sentences. He was given steroids, albuterol, Atrovent. Reports minimal improvement. Denies any recent fevers. Does report cough.  Past Medical History:  Diagnosis Date  . Asthma     Patient Active Problem List   Diagnosis Date Noted  . Asthma, chronic 01/16/2015  . Status asthmaticus 12/24/2014    History reviewed. No pertinent surgical history.     Home Medications    Prior to Admission medications   Medication Sig Start Date End Date Taking? Authorizing Provider  albuterol (PROVENTIL HFA;VENTOLIN HFA) 108 (90 Base) MCG/ACT inhaler Inhale 2 puffs into the lungs every 4 (four) hours as needed for wheezing or shortness of breath. 06/08/15   Praveen Mannam, MD  albuterol (PROVENTIL) (2.5 MG/3ML) 0.083% nebulizer solution Take 3 mLs (2.5 mg total) by nebulization every 6 (six) hours as needed for wheezing or shortness of breath. Dx: J45.50 06/08/15   Chilton GreathousePraveen Mannam, MD  budesonide-formoterol (SYMBICORT) 160-4.5 MCG/ACT inhaler Inhale 2 puffs into the lungs 2 (two) times daily. 03/31/16   Margarita Grizzleanielle Ray, MD  loratadine (CLARITIN) 10 MG tablet Take 1 tablet (10 mg total) by mouth daily. 02/21/16   Antony MaduraKelly Humes, PA-C  montelukast (SINGULAIR) 10 MG tablet Take 1 tablet (10 mg total) by mouth at bedtime. 02/21/16   Antony MaduraKelly Humes, PA-C  predniSONE (DELTASONE) 10 MG tablet Take 2 tablets (20 mg total) by mouth daily. 03/31/16    Margarita Grizzleanielle Ray, MD    Family History History reviewed. No pertinent family history.  Social History Social History  Substance Use Topics  . Smoking status: Never Smoker  . Smokeless tobacco: Never Used  . Alcohol use No     Allergies   Patient has no known allergies.   Review of Systems Review of Systems  Constitutional: Negative for fever.  Respiratory: Positive for cough, shortness of breath and wheezing.   Cardiovascular: Negative for chest pain.  Gastrointestinal: Negative for abdominal pain, nausea and vomiting.  All other systems reviewed and are negative.    Physical Exam Updated Vital Signs BP 123/78 (BP Location: Right Arm)   Pulse 112   Temp 98.3 F (36.8 C) (Oral)   Resp 21   Ht 5\' 10"  (1.778 m)   Wt 150 lb (68 kg)   SpO2 100%   BMI 21.52 kg/m   Physical Exam  Constitutional: He is oriented to person, place, and time. He appears well-developed and well-nourished. No distress.  HENT:  Head: Normocephalic and atraumatic.  Eyes: Pupils are equal, round, and reactive to light.  Cardiovascular: Regular rhythm and normal heart sounds.   No murmur heard. Tachycardia  Pulmonary/Chest: Effort normal and breath sounds normal. No respiratory distress. He has no wheezes.  Tight in all lung fields, worse upper lung fields, end expiratory wheeze, speaking in short sentences  Abdominal: Soft. There is no tenderness.  Musculoskeletal: He exhibits no edema.  Neurological: He is alert and  oriented to person, place, and time.  Skin: Skin is warm and dry.  Psychiatric: He has a normal mood and affect.  Nursing note and vitals reviewed.    ED Treatments / Results  Labs (all labs ordered are listed, but only abnormal results are displayed) Labs Reviewed  CBC WITH DIFFERENTIAL/PLATELET - Abnormal; Notable for the following:       Result Value   WBC 15.5 (*)    Hemoglobin 12.9 (*)    HCT 38.0 (*)    Neutro Abs 12.2 (*)    All other components within normal  limits  BASIC METABOLIC PANEL - Abnormal; Notable for the following:    Glucose, Bld 101 (*)    All other components within normal limits    EKG  EKG Interpretation  Date/Time:  Tuesday April 01 2016 05:15:04 EST Ventricular Rate:  111 PR Interval:    QRS Duration: 81 QT Interval:  317 QTC Calculation: 431 R Axis:   84 Text Interpretation:  Sinus tachycardia Right atrial enlargement Confirmed by Wilkie AyeHORTON  MD, Anakaren Campion ([id number]) on 04/01/2016 6:31:46 AM       Radiology Dg Chest 2 View  Result Date: 03/31/2016 CLINICAL DATA:  Cough and fever. EXAM: CHEST  2 VIEW COMPARISON:  12/26/2014. FINDINGS: Interim extubation and removal of NG tube. Lungs are clear. No pleural effusion or pneumothorax. Heart size normal. IMPRESSION: Interim extubation removal of NG tube. No acute cardiopulmonary disease. Electronically Signed   By: Maisie Fushomas  Register   On: 03/31/2016 07:04    Procedures Procedures (including critical care time)  Medications Ordered in ED Medications  magnesium sulfate IVPB 2 g 50 mL (not administered)  albuterol (PROVENTIL) (2.5 MG/3ML) 0.083% nebulizer solution 5 mg (5 mg Nebulization Given 04/01/16 0557)  albuterol (PROVENTIL,VENTOLIN) solution continuous neb (15 mg/hr Nebulization Given 04/01/16 0557)     Initial Impression / Assessment and Plan / ED Course  I have reviewed the triage vital signs and the nursing notes.  Pertinent labs & imaging results that were available during my care of the patient were reviewed by me and considered in my medical decision making (see chart for details).  Clinical Course     Patient presents with shortness of breath. History of persistent severe asthma. Fairly tight on exam. Hypoxic for EMS. Continuous DuoNeb ordered. He has already received steroids. I need him ordered. Chest x-ray basic labwork ordered in anticipation of admission given failure of outpatient treatment.  6:41 AM Patient with some improvement of aeration.  Continues to wheeze. Patient states that he has not felt well enough to go home. Usually when he is "this bad" he comes to the hospital. He is concerned because he is out of his Symbicort at home. Will call for observation admission for frequent duo nebs and asthma exacerbation.   Final Clinical Impressions(s) / ED Diagnoses   Final diagnoses:  Severe persistent asthma with acute exacerbation    New Prescriptions New Prescriptions   No medications on file     Shon Batonourtney F Zaylynn Rickett, MD 04/01/16 [phone]

## 2016-04-01 NOTE — ED Provider Notes (Signed)
MSE was initiated and I personally evaluated the patient and placed orders (if any) at  5:41 AM on April 01, 2016.  The patient appears stable so that the remainder of the MSE may be completed by another provider.  Patient presents with shortness of breath. Seen and evaluated yesterday for the same. Was discharged home. Did not get his inhaler filled. Has had worsening shortness of breath and cough. 88% on room air for EMS. Received Solu-Medrol and currently receiving a DuoNeb. Reports he feels somewhat better. Speaking in short sentences. Diminished breath sounds in all lung fields, tight, expiratory wheeze. Continuous DuoNeb ordered. Lab work and chest x-ray ordered.   Shon Batonourtney F Horton, MD 04/01/16 [phone]

## 2016-04-01 NOTE — Care Management Note (Addendum)
Case Management Note  Patient Details  Name: Jeremy Molina MRN: [id number] Date of Birth: [birth year]  Subjective/Objective:                  25 year old male who presents with shortness of breath. Was seen and evaluated yesterday for the same. Diagnosed with an asthma exacerbation. From home alone.  Action/Plan: Follow for disposition needs. / Admit to OBSERVATION.   Expected Discharge Date:  04/03/16               Expected Discharge Plan:  Home/Self Care  In-House Referral:  NA  Discharge planning Services  CM Consult  Post Acute Care Choice:  NA Choice offered to:  NA  DME Arranged:  N/A DME Agency:  NA  HH Arranged:  NA HH Agency:  NA  Status of Service:  In process, will continue to follow  If discussed at Long Length of Stay Meetings, dates discussed:    Additional Comments: EDCM consulted to assist pt with medication assistance.  Pt has Medicare and Medicaid insurance and will not qualify for discount medication programs at this time.    Oletta CohnWood, Ipek Westra, RN 04/01/2016, 11:25 AM

## 2016-04-01 NOTE — ED Triage Notes (Signed)
Pt BIB EMS with SOB and hx of asthma. Was treated for same yesterday and discharged, though reports never feeling as though his breathing was much better. Per EMS, pt initially 88% on RA, and speaking in 1-2 word increments. Inspiratory & expiratory wheezes throughtout. Has rx for albuterol but is currently out of it. Received 10mg  albuterol, 1mg  atrovent, and 125mg  solu-medrol.

## 2016-04-01 NOTE — Care Management Note (Signed)
Case Management Note  Patient Details  Name: Jeremy Molina MRN: [id number] Date of Birth: [birth year]  Subjective/Objective:    CM following for progression and d/c planning.                 Action/Plan: 04/01/2016 Noted consult re medication assistance for this pt. Per hospital records this pt has Medicare and Medicaid, will try to clarify with pt . If pt has any insurance we will not be able to assist. Will continue to follow.   Expected Discharge Date:  04/03/16               Expected Discharge Plan:  Home/Self Care  In-House Referral:  NA  Discharge planning Services  CM Consult  Post Acute Care Choice:  NA Choice offered to:  NA  DME Arranged:  N/A DME Agency:  NA  HH Arranged:  NA HH Agency:  NA  Status of Service:  In process, will continue to follow  If discussed at Long Length of Stay Meetings, dates discussed:    Additional Comments:  Starlyn SkeansRoyal, [name] U, RN 04/01/2016, 3:38 PM

## 2016-04-02 DIAGNOSIS — J4521 Mild intermittent asthma with (acute) exacerbation: Secondary | ICD-10-CM | POA: Diagnosis not present

## 2016-04-02 DIAGNOSIS — J4551 Severe persistent asthma with (acute) exacerbation: Secondary | ICD-10-CM | POA: Diagnosis not present

## 2016-04-02 LAB — CBC
HCT: 40.6 % (ref 39.0–52.0)
Hemoglobin: 13.4 g/dL (ref 13.0–17.0)
MCH: 27.7 pg (ref 26.0–34.0)
MCHC: 33 g/dL (ref 30.0–36.0)
MCV: 83.9 fL (ref 78.0–100.0)
PLATELETS: 260 10*3/uL (ref 150–400)
RBC: 4.84 MIL/uL (ref 4.22–5.81)
RDW: 13.4 % (ref 11.5–15.5)
WBC: 15.6 10*3/uL — ABNORMAL HIGH (ref 4.0–10.5)

## 2016-04-02 LAB — BASIC METABOLIC PANEL
Anion gap: 7 (ref 5–15)
BUN: 10 mg/dL (ref 6–20)
CHLORIDE: 107 mmol/L (ref 101–111)
CO2: 23 mmol/L (ref 22–32)
CREATININE: 0.9 mg/dL (ref 0.61–1.24)
Calcium: 9.2 mg/dL (ref 8.9–10.3)
GFR calc Af Amer: >60 mL/min (ref >60)
GLUCOSE: 137 mg/dL — AB (ref 65–99)
Potassium: 4.9 mmol/L (ref 3.5–5.1)
SODIUM: 137 mmol/L (ref 135–145)

## 2016-04-02 MED ORDER — GUAIFENESIN ER 600 MG PO TB12
600.0000 mg | ORAL_TABLET | Freq: Two times a day (BID) | ORAL | Status: DC
  Administered 2016-04-02: 600 mg via ORAL
  Filled 2016-04-02: qty 1

## 2016-04-02 MED ORDER — LEVOFLOXACIN 500 MG PO TABS: 500.0000 mg | ORAL_TABLET | Freq: Every day | ORAL | 0 refills | Status: AC

## 2016-04-02 MED ORDER — MONTELUKAST SODIUM 10 MG PO TABS: 10.0000 mg | ORAL_TABLET | Freq: Every day | ORAL | 0 refills | Status: AC

## 2016-04-02 MED ORDER — ALBUTEROL SULFATE (2.5 MG/3ML) 0.083% IN NEBU: 2.5000 mg | INHALATION_SOLUTION | Freq: Four times a day (QID) | RESPIRATORY_TRACT | 0 refills | Status: DC | PRN

## 2016-04-02 MED ORDER — ALBUTEROL SULFATE HFA 108 (90 BASE) MCG/ACT IN AERS: 2.0000 | INHALATION_SPRAY | RESPIRATORY_TRACT | 5 refills | Status: DC | PRN

## 2016-04-02 MED ORDER — BUDESONIDE-FORMOTEROL FUMARATE 160-4.5 MCG/ACT IN AERO: 2.0000 | INHALATION_SPRAY | Freq: Two times a day (BID) | RESPIRATORY_TRACT | 0 refills | Status: DC

## 2016-04-02 MED ORDER — PREDNISONE 10 MG PO TABS: ORAL_TABLET | ORAL | 0 refills | Status: DC

## 2016-04-02 MED ORDER — GUAIFENESIN ER 600 MG PO TB12: 600.0000 mg | ORAL_TABLET | Freq: Two times a day (BID) | ORAL | 0 refills | Status: DC

## 2016-04-02 NOTE — Clinical Social Work Note (Signed)
CSW talked with patient at the bedside, prior to his discharge regarding disability. Patient reported that he was receiving disability and it was cut-off and the SSA was requesting he provide medical records and this was discussed and CSW advised patient what to do after discharge from hospital. Patient thanked CSW for the information.  Genelle BalVanessa Kathryn Linarez, MSW, LCSW Licensed Clinical Social Worker Clinical Social Work Department [facility] [phone]

## 2016-04-02 NOTE — Progress Notes (Signed)
Patient ambulated entire length of hall, O2 sat >92% on room air, no c/o SOB or dyspnea.

## 2016-04-02 NOTE — Progress Notes (Signed)
Patient was discharged to home, AVS was reviewed and prescriptions sent to patients pharmacy. Patient ensured he had all belongings, and a follow up appointment was scheduled. Patient received a bus pass and staff member escorted patient to emergency room exit.

## 2016-04-07 NOTE — Discharge Summary (Signed)
[facility] Discharge Summary   Patient: Jeremy Molina ZOX:[id number]   [facility] DOB: Jan 19, 1991   Date of admission: 04/01/2016   Date of discharge: 04/02/2016     Discharge Diagnoses:  Principal Problem:   Acute asthma exacerbation Active Problems:   Acute respiratory failure with hypoxia (HCC)   Asthmatic bronchitis   Dehydration, moderate   Admitted From: Home Disposition:  Home  Recommendations for Outpatient Follow-up:  1. Follow-up with PCP in one   Follow-up Information    [facility]. Schedule an appointment as soon as possible for a visit in 1 week.   Why:  Appointment Date: 04/09/2016 11:15a Contact information: [id number] [place] [phone]          Diet recommendation: Regular diet  Activity: The patient is advised to gradually reintroduce usual activities.  Discharge Condition: good  Code Status: Full code  History of present illness: As per the H and P dictated on admission, "Jeremy Molina is a 25 y.o. male with medical history significant for asthma. Evaluated in the ER on 12/18 for shortness of breath and wheezing. Was treated appropriately with nebulizers and steroids and discharged with recommendations to refill his Symbicort prescription. The patient reported he was unable to obtain his Symbicort and redeveloped shortness of breath symptoms. EMS was called to the home and patient was found to have O2 saturations of 88% on room air and was having difficulty speaking secondary to shortness of breath. He had diffuse inspiratory and expiratory wheezes. Was given continuous nebulizer 10 mg in the field with 1 mg of Atrovent and 125 mg of Solu-Medrol IV. The time he arrived to the ER he was slightly improved but still speaking in short sentences with diminished breath throughout all fields with tight expiratory wheezing. Additional DuoNeb was ordered. Chest x-ray without evidence of focal infiltrate.  Oxygen was placed. In my discussion with the patient he reports being sick for 3 days with cough and productive yellow sputum and low-grade fevers. Denies sick contacts."  Hospital Course:   Summary of his active problems in the hospital is as following.   Acute asthma exacerbation with asthmatic bronchitis   Acute respiratory failure with hypoxia  Rhinovirus bronchitis -Patient presents with 3 days of respiratory symptoms associated with mild fever and cough exacerbating his underlying asthma -Symptom management with steroids and nebulizers Continue antibiotic continue Mucinex.  Active Problems:   Dehydration, moderate -Patient admits to poor oral intake for 3 days Resolved with IV fluids.  All other chronic medical condition were stable during the hospitalization.  Patient was amOther rating oral diet. bulatory without any assistance. On the day of the discharge the patient's oxygenation was stable on both rest as well as on exertion on room air, and no other acute medical condition were reported by patient. the patient was felt safe to be discharge at home with family.  Procedures and Results:  None   Consultations:  None  DISCHARGE MEDICATION: Discharge Medication List as of 04/02/2016  2:30 PM    START taking these medications   Details  guaiFENesin (MUCINEX) 600 MG 12 hr tablet Take 1 tablet (600 mg total) by mouth 2 (two) times daily., Starting Wed 04/02/2016, Normal    levofloxacin (LEVAQUIN) 500 MG tablet Take 1 tablet (500 mg total) by mouth daily., Starting Wed 04/02/2016, Until Sun 04/06/2016, Normal      CONTINUE these medications which have CHANGED   Details  albuterol (PROVENTIL HFA;VENTOLIN HFA) 108 (90  Base) MCG/ACT inhaler Inhale 2 puffs into the lungs every 4 (four) hours as needed for wheezing or shortness of breath., Starting Wed 04/02/2016, Normal    albuterol (PROVENTIL) (2.5 MG/3ML) 0.083% nebulizer solution Take 3 mLs (2.5 mg total) by  nebulization every 6 (six) hours as needed for wheezing or shortness of breath. Dx: J45.50, Starting Wed 04/02/2016, Normal    budesonide-formoterol (SYMBICORT) 160-4.5 MCG/ACT inhaler Inhale 2 puffs into the lungs 2 (two) times daily., Starting Wed 04/02/2016, Normal    montelukast (SINGULAIR) 10 MG tablet Take 1 tablet (10 mg total) by mouth at bedtime., Starting Wed 04/02/2016, Normal    predniSONE (DELTASONE) 10 MG tablet Take 50mg  daily for 3days,Take 40mg  daily for 3days,Take 30mg  daily for 3days,Take 20mg  daily for 3days,Take 10mg  daily for 3days, then stop, Normal      CONTINUE these medications which have NOT CHANGED   Details  loratadine (CLARITIN) 10 MG tablet Take 1 tablet (10 mg total) by mouth daily., Starting Thu 02/21/2016, Print       No Known Allergies Discharge Instructions    Diet general    Complete by:  As directed    Discharge instructions    Complete by:  As directed    It is important that you read following instructions as well as go over your medication list with RN to help you understand your care after this hospitalization.  Discharge Instructions: Please follow-up with PCP in one week  Please request your primary care physician to go over all Hospital Tests and Procedure/Radiological results at the follow up,  Please get all Hospital records sent to your PCP by signing hospital release before you go home.   Do not take more than prescribed Pain, Sleep and Anxiety Medications. You were cared for by a hospitalist during your hospital stay. If you have any questions about your discharge medications or the care you received while you were in the hospital after you are discharged, you can call the unit and ask to speak with the hospitalist on call if the hospitalist that took care of you is not available.  Once you are discharged, your primary care physician will handle any further medical issues. Please note that NO REFILLS for any discharge medications will  be authorized once you are discharged, as it is imperative that you return to your primary care physician (or establish a relationship with a primary care physician if you do not have one) for your aftercare needs so that they can reassess your need for medications and monitor your lab values. You Must read complete instructions/literature along with all the possible adverse reactions/side effects for all the Medicines you take and that have been prescribed to you. Take any new Medicines after you have completely understood and accept all the possible adverse reactions/side effects. Wear Seat belts while driving. If you have smoked or chewed Tobacco in the last 2 yrs please stop smoking and/or stop any Recreational drug use.   Increase activity slowly    Complete by:  As directed      Discharge Exam: Filed Weights   04/01/16 0522 04/02/16 0500  Weight: 68 kg (150 lb) 68 kg (149 lb 14.6 oz)   Vitals:   04/02/16 0500 04/02/16 0932  BP: 104/66 (!) 102/56  Pulse: 88 (!) 112  Resp: 16 17  Temp: 97.9 F (36.6 C) 98 F (36.7 C)   General: Appear in no distress, no Rash; Oral Mucosa moist. Cardiovascular: S1 and S2 Present, no Murmur, no JVD  Respiratory: Bilateral Air entry present and Clear to Auscultation, no Crackles, no wheezes Abdomen: Bowel Sound present, Soft and no tenderness Extremities: no Pedal edema, no calf tenderness Neurology: Grossly no focal neuro deficit.  The results of significant diagnostics from this hospitalization (including imaging, microbiology, ancillary and laboratory) are listed below for reference.    Significant Diagnostic Studies: Dg Chest 2 View  Result Date: 04/01/2016 CLINICAL DATA:  Cough. EXAM: CHEST  2 VIEW COMPARISON:  03/31/2016 . FINDINGS: Mediastinum and hilar structures are normal. Heart size normal. Lungs are clear mild left base subsegmental atelectasis. Lungs are otherwise clear. No pleural effusion or pneumothorax. IMPRESSION: Mild left base  subsegmental atelectasis otherwise negative exam. Electronically Signed   By: Maisie Fushomas  Register   On: 04/01/2016 06:21   Dg Chest 2 View  Result Date: 03/31/2016 CLINICAL DATA:  Cough and fever. EXAM: CHEST  2 VIEW COMPARISON:  12/26/2014. FINDINGS: Interim extubation and removal of NG tube. Lungs are clear. No pleural effusion or pneumothorax. Heart size normal. IMPRESSION: Interim extubation removal of NG tube. No acute cardiopulmonary disease. Electronically Signed   By: Maisie Fushomas  Register   On: 03/31/2016 07:04    Microbiology: Recent Results (from the past 240 hour(s))  Respiratory Panel by PCR     Status: Abnormal   Collection Time: 04/01/16  2:13 PM  Result Value Ref Range Status   Adenovirus NOT DETECTED NOT DETECTED Final   Coronavirus 229E NOT DETECTED NOT DETECTED Final   Coronavirus HKU1 NOT DETECTED NOT DETECTED Final   Coronavirus NL63 NOT DETECTED NOT DETECTED Final   Coronavirus OC43 NOT DETECTED NOT DETECTED Final   Metapneumovirus NOT DETECTED NOT DETECTED Final   Rhinovirus / Enterovirus DETECTED (A) NOT DETECTED Final   Influenza A NOT DETECTED NOT DETECTED Final   Influenza B NOT DETECTED NOT DETECTED Final   Parainfluenza Virus 1 NOT DETECTED NOT DETECTED Final   Parainfluenza Virus 2 NOT DETECTED NOT DETECTED Final   Parainfluenza Virus 3 NOT DETECTED NOT DETECTED Final   Parainfluenza Virus 4 NOT DETECTED NOT DETECTED Final   Respiratory Syncytial Virus NOT DETECTED NOT DETECTED Final   Bordetella pertussis NOT DETECTED NOT DETECTED Final   Chlamydophila pneumoniae NOT DETECTED NOT DETECTED Final   Mycoplasma pneumoniae NOT DETECTED NOT DETECTED Final     Labs: CBC:  Recent Labs Lab 04/01/16 0537 04/02/16 0527  WBC 15.5* 15.6*  NEUTROABS 12.2*  --   HGB 12.9* 13.4  HCT 38.0* 40.6  MCV 83.0 83.9  PLT 240 260   Basic Metabolic Panel:  Recent Labs Lab 04/01/16 0537 04/01/16 1048 04/02/16 0527  NA 140  --  137  K 3.5  --  4.9  CL 106  --  107    CO2 25  --  23  GLUCOSE 101*  --  137*  BUN 13  --  10  CREATININE 1.05  --  0.90  CALCIUM 9.1  --  9.2  MG  --  2.0  --    Time spent: 30 minutes  Signed:  Guiseppe Flanagan  [facility] 04/02/2016 , 8:32 AM

## 2016-06-06 ENCOUNTER — Encounter (HOSPITAL_COMMUNITY): Payer: Self-pay | Admitting: *Deleted

## 2016-06-06 ENCOUNTER — Emergency Department (HOSPITAL_COMMUNITY)
Admission: EM | Admit: 2016-06-06 | Discharge: 2016-06-06 | Disposition: A | Discharge status: Home / Self Care | Payer: Medicare Other | Attending: Emergency Medicine | Admitting: Emergency Medicine

## 2016-06-06 DIAGNOSIS — Z79899 Other long term (current) drug therapy: Secondary | ICD-10-CM | POA: Diagnosis not present

## 2016-06-06 DIAGNOSIS — M436 Torticollis: Secondary | ICD-10-CM

## 2016-06-06 DIAGNOSIS — F17200 Nicotine dependence, unspecified, uncomplicated: Secondary | ICD-10-CM | POA: Diagnosis not present

## 2016-06-06 DIAGNOSIS — M542 Cervicalgia: Secondary | ICD-10-CM | POA: Diagnosis present

## 2016-06-06 DIAGNOSIS — J45909 Unspecified asthma, uncomplicated: Secondary | ICD-10-CM | POA: Diagnosis not present

## 2016-06-06 MED ORDER — BACLOFEN 10 MG PO TABS: 10.0000 mg | ORAL_TABLET | Freq: Three times a day (TID) | ORAL | 0 refills | Status: AC

## 2016-06-06 MED ORDER — MELOXICAM 15 MG PO TABS: 15.0000 mg | ORAL_TABLET | Freq: Every day | ORAL | 0 refills | Status: DC

## 2016-06-06 NOTE — ED Notes (Signed)
C/o pain in the left side of his neck, states he was fine when he went to bed last pm woke up this am with pain. States it hurts worse when he try's to turn his neck to the left. Warm pack given

## 2016-06-06 NOTE — ED Triage Notes (Signed)
Pt reports waking up & not being able to turn neck to the L side, denies injury to the area, has not taken OTC for pain, ambulatory, A&O x4

## 2016-06-06 NOTE — ED Provider Notes (Signed)
[facility]-EMERGENCY DEPT Provider Note   CSN: [id number] Arrival date & time: 06/06/16  1009  By signing my name below, I, Jeremy Molina, attest that this documentation has been prepared under the direction and in the presence of [name], PA-C. Electronically Signed: Teofilo Molina, ED Scribe. 06/06/2016. 10:37 AM.    History   Chief Complaint No chief complaint on file.  The history is provided by the patient. No language interpreter was used.   HPI Comments:  Jeremy Molina is a 26 y.o. male who presents to the Emergency Department complaining of constant neck pain that began this morning. Pt states that he woke up this morning and had sever pain to the left side of his neck. He states that he is unable to turn his head to the left side. Pt denies any injury/trauma. Pt has used ice with no relief. Pt denies weakness, numbness.   Past Medical History:  Diagnosis Date  . Asthma     Patient Active Problem List   Diagnosis Date Noted  . Acute asthma exacerbation 04/01/2016  . Acute respiratory failure with hypoxia (HCC) 04/01/2016  . Asthmatic bronchitis 04/01/2016  . Dehydration, moderate 04/01/2016  . Tachycardia   . Asthma, chronic 01/16/2015  . Status asthmaticus 12/24/2014    History reviewed. No pertinent surgical history.     Home Medications    Prior to Admission medications   Medication Sig Start Date End Date Taking? Authorizing Provider  albuterol (PROVENTIL HFA;VENTOLIN HFA) 108 (90 Base) MCG/ACT inhaler Inhale 2 puffs into the lungs every 4 (four) hours as needed for wheezing or shortness of breath. 04/02/16   Rolly Salter, MD  albuterol (PROVENTIL) (2.5 MG/3ML) 0.083% nebulizer solution Take 3 mLs (2.5 mg total) by nebulization every 6 (six) hours as needed for wheezing or shortness of breath. Dx: Z30.86 04/02/16   Rolly Salter, MD  baclofen (LIORESAL) 10 MG tablet Take 1 tablet (10 mg total) by mouth 3 (three) times daily. 06/06/16   [name], PA-C  budesonide-formoterol (SYMBICORT) 160-4.5 MCG/ACT inhaler Inhale 2 puffs into the lungs 2 (two) times daily. 04/02/16   Rolly Salter, MD  guaiFENesin (MUCINEX) 600 MG 12 hr tablet Take 1 tablet (600 mg total) by mouth 2 (two) times daily. 04/02/16   Rolly Salter, MD  loratadine (CLARITIN) 10 MG tablet Take 1 tablet (10 mg total) by mouth daily. 02/21/16   Antony Madura, PA-C  meloxicam (MOBIC) 15 MG tablet Take 1 tablet (15 mg total) by mouth daily. Take 1 daily with food. 06/06/16   [name], PA-C  montelukast (SINGULAIR) 10 MG tablet Take 1 tablet (10 mg total) by mouth at bedtime. 04/02/16   Rolly Salter, MD  predniSONE (DELTASONE) 10 MG tablet Take 50mg  daily for 3days,Take 40mg  daily for 3days,Take 30mg  daily for 3days,Take 20mg  daily for 3days,Take 10mg  daily for 3days, then stop 04/02/16   Rolly Salter, MD    Family History No family history on file.  Social History Social History  Substance Use Topics  . Smoking status: Current Some Day Smoker    Packs/day: 0.10  . Smokeless tobacco: Never Used  . Alcohol use No     Comment: occassional     Allergies   Patient has no known allergies.   Review of Systems Review of Systems  Musculoskeletal: Positive for neck pain.  Neurological: Negative for weakness and numbness.     Physical Exam Updated Vital Signs BP 119/89 (BP Location: Left Arm)  Pulse 65   Temp 98 F (36.7 C) (Oral)   Resp 18   SpO2 100%   Physical Exam  Constitutional: He appears well-developed and well-nourished. No distress.  HENT:  Head: Normocephalic and atraumatic.  Eyes: Conjunctivae are normal.  Cardiovascular: Normal rate.   Pulmonary/Chest: Effort normal.  Abdominal: He exhibits no distension.  Musculoskeletal:  Spasm of left sternocleidomastoid. Holding head to right with support, pain with movement of neck past midpoint to the left. Normal upper arm strength.   Neurological: He is alert.  Skin: Skin is warm and  dry.  Psychiatric: He has a normal mood and affect.  Nursing note and vitals reviewed.    ED Treatments / Results  DIAGNOSTIC STUDIES:  Oxygen Saturation is 100% on RA, normal by my interpretation.    COORDINATION OF CARE:  10:35 AM Discussed treatment plan with pt at bedside and pt agreed to plan.   Labs (all labs ordered are listed, but only abnormal results are displayed) Labs Reviewed - No data to display  EKG  EKG Interpretation None       Radiology No results found.  Procedures Procedures (including critical care time)  Medications Ordered in ED Medications - No data to display   Initial Impression / Assessment and Plan / ED Course  I have reviewed the triage vital signs and the nursing notes.  Pertinent labs & imaging results that were available during my care of the patient were reviewed by me and considered in my medical decision making (see chart for details).     Patient with acute torticollis.  I have explained the diagnosis. Patient given home exercises and supportive care instructions, along with baclofen and mobility. . Follow up with PCP. Appears safe for discharge at this time Final Clinical Impressions(s) / ED Diagnoses   Final diagnoses:  Acute torticollis    New Prescriptions Discharge Medication List as of 06/06/2016 10:48 AM    START taking these medications   Details  baclofen (LIORESAL) 10 MG tablet Take 1 tablet (10 mg total) by mouth 3 (three) times daily., Starting Fri 06/06/2016, Print    meloxicam (MOBIC) 15 MG tablet Take 1 tablet (15 mg total) by mouth daily. Take 1 daily with food., Starting Fri 06/06/2016, Print      I personally performed the services described in this documentation, which was scribed in my presence. The recorded information has been reviewed and is accurate.      Arthor Captainbigail Kailene Steinhart, PA-C 06/06/16 1106    Shaune Pollackameron Isaacs, MD 06/06/16 [phone]

## 2016-06-06 NOTE — Discharge Instructions (Signed)
SEEK MEDICAL CARE IF: You develop a fever. SEEK IMMEDIATE MEDICAL CARE IF: You develop difficulty breathing. You develop noisy breathing (stridor). You start drooling. You have trouble swallowing or have pain with swallowing. You develop numbness or weakness in your hands or feet. You have changes in your speech, understanding, or vision. Your pain gets worse.

## 2016-08-21 ENCOUNTER — Encounter (HOSPITAL_COMMUNITY): Payer: Self-pay | Admitting: Nurse Practitioner

## 2016-08-21 ENCOUNTER — Emergency Department (HOSPITAL_COMMUNITY)
Admission: EM | Admit: 2016-08-21 | Discharge: 2016-08-22 | Disposition: A | Discharge status: Home / Self Care | Payer: Medicare Other | Attending: Emergency Medicine | Admitting: Emergency Medicine

## 2016-08-21 DIAGNOSIS — Y999 Unspecified external cause status: Secondary | ICD-10-CM | POA: Insufficient documentation

## 2016-08-21 DIAGNOSIS — Z79899 Other long term (current) drug therapy: Secondary | ICD-10-CM | POA: Insufficient documentation

## 2016-08-21 DIAGNOSIS — W503XXA Accidental bite by another person, initial encounter: Secondary | ICD-10-CM

## 2016-08-21 DIAGNOSIS — Y939 Activity, unspecified: Secondary | ICD-10-CM | POA: Insufficient documentation

## 2016-08-21 DIAGNOSIS — J45909 Unspecified asthma, uncomplicated: Secondary | ICD-10-CM | POA: Diagnosis not present

## 2016-08-21 DIAGNOSIS — Z23 Encounter for immunization: Secondary | ICD-10-CM | POA: Insufficient documentation

## 2016-08-21 DIAGNOSIS — F17200 Nicotine dependence, unspecified, uncomplicated: Secondary | ICD-10-CM | POA: Diagnosis not present

## 2016-08-21 DIAGNOSIS — S1195XA Open bite of unspecified part of neck, initial encounter: Secondary | ICD-10-CM | POA: Diagnosis present

## 2016-08-21 DIAGNOSIS — Y929 Unspecified place or not applicable: Secondary | ICD-10-CM | POA: Diagnosis not present
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
## 2016-08-22 DIAGNOSIS — S1195XA Open bite of unspecified part of neck, initial encounter: Secondary | ICD-10-CM | POA: Diagnosis not present

## 2016-08-22 MED ORDER — TETANUS-DIPHTH-ACELL PERTUSSIS 5-2.5-18.5 LF-MCG/0.5 IM SUSP
0.5000 mL | Freq: Once | INTRAMUSCULAR | Status: AC
  Administered 2016-08-22: 0.5 mL via INTRAMUSCULAR
  Filled 2016-08-22: qty 0.5

## 2016-08-22 MED ORDER — AMOXICILLIN-POT CLAVULANATE 875-125 MG PO TABS
1.0000 | ORAL_TABLET | Freq: Once | ORAL | Status: AC
Start: 2016-08-22 — End: 2016-08-22
  Administered 2016-08-22: 1 via ORAL
  Filled 2016-08-22: qty 1

## 2016-08-22 MED ORDER — AMOXICILLIN-POT CLAVULANATE 875-125 MG PO TABS: 1.0000 | ORAL_TABLET | Freq: Two times a day (BID) | ORAL | 0 refills | Status: DC
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
## 2016-09-08 ENCOUNTER — Encounter (HOSPITAL_COMMUNITY): Payer: Self-pay | Admitting: Nurse Practitioner

## 2016-09-08 ENCOUNTER — Emergency Department (HOSPITAL_COMMUNITY)
Admission: EM | Admit: 2016-09-08 | Discharge: 2016-09-08 | Disposition: A | Discharge status: Home / Self Care | Payer: Medicare Other | Attending: Emergency Medicine | Admitting: Emergency Medicine

## 2016-09-08 DIAGNOSIS — F17200 Nicotine dependence, unspecified, uncomplicated: Secondary | ICD-10-CM | POA: Diagnosis not present

## 2016-09-08 DIAGNOSIS — R0602 Shortness of breath: Secondary | ICD-10-CM | POA: Diagnosis present

## 2016-09-08 DIAGNOSIS — J4551 Severe persistent asthma with (acute) exacerbation: Secondary | ICD-10-CM

## 2016-09-08 DIAGNOSIS — Z79899 Other long term (current) drug therapy: Secondary | ICD-10-CM | POA: Diagnosis not present

## 2016-09-08 LAB — BASIC METABOLIC PANEL
ANION GAP: 7 (ref 5–15)
BUN: 21 mg/dL — ABNORMAL HIGH (ref 6–20)
CALCIUM: 9.2 mg/dL (ref 8.9–10.3)
CHLORIDE: 105 mmol/L (ref 101–111)
CO2: 26 mmol/L (ref 22–32)
Creatinine, Ser: 1.19 mg/dL (ref 0.61–1.24)
GFR calc Af Amer: >60 mL/min (ref >60)
GFR calc non Af Amer: >60 mL/min (ref >60)
Glucose, Bld: 109 mg/dL — ABNORMAL HIGH (ref 65–99)
Potassium: 3.6 mmol/L (ref 3.5–5.1)
Sodium: 138 mmol/L (ref 135–145)

## 2016-09-08 LAB — CBC WITH DIFFERENTIAL/PLATELET
BASOS ABS: 0 10*3/uL (ref 0.0–0.1)
Basophils Relative: 0 %
Eosinophils Absolute: 0.2 10*3/uL (ref 0.0–0.7)
Eosinophils Relative: 1 %
HEMATOCRIT: 42.7 % (ref 39.0–52.0)
Hemoglobin: 14.2 g/dL (ref 13.0–17.0)
LYMPHS PCT: 11 %
Lymphs Abs: 1.4 10*3/uL (ref 0.7–4.0)
MCH: 27.8 pg (ref 26.0–34.0)
MCHC: 33.3 g/dL (ref 30.0–36.0)
MCV: 83.6 fL (ref 78.0–100.0)
MONO ABS: 1 10*3/uL (ref 0.1–1.0)
MONOS PCT: 8 %
NEUTROS ABS: 10.3 10*3/uL — AB (ref 1.7–7.7)
NEUTROS PCT: 80 %
Platelets: 254 10*3/uL (ref 150–400)
RBC: 5.11 MIL/uL (ref 4.22–5.81)
RDW: 13 % (ref 11.5–15.5)
WBC: 12.9 10*3/uL — ABNORMAL HIGH (ref 4.0–10.5)

## 2016-09-08 MED ORDER — ALBUTEROL SULFATE HFA 108 (90 BASE) MCG/ACT IN AERS: 2.0000 | INHALATION_SPRAY | Freq: Four times a day (QID) | RESPIRATORY_TRACT | 1 refills | Status: DC | PRN

## 2016-09-08 MED ORDER — METHYLPREDNISOLONE SODIUM SUCC 125 MG IJ SOLR
125.0000 mg | Freq: Once | INTRAMUSCULAR | Status: AC
  Administered 2016-09-08: 125 mg via INTRAVENOUS
  Filled 2016-09-08: qty 2

## 2016-09-08 MED ORDER — MAGNESIUM SULFATE 2 GM/50ML IV SOLN
2.0000 g | INTRAVENOUS | Status: AC
  Administered 2016-09-08: 2 g via INTRAVENOUS
  Filled 2016-09-08: qty 50

## 2016-09-08 MED ORDER — IPRATROPIUM BROMIDE 0.02 % IN SOLN
0.5000 mg | Freq: Once | RESPIRATORY_TRACT | Status: AC
  Administered 2016-09-08: 0.5 mg via RESPIRATORY_TRACT
  Filled 2016-09-08: qty 2.5

## 2016-09-08 MED ORDER — ALBUTEROL SULFATE HFA 108 (90 BASE) MCG/ACT IN AERS
1.0000 | INHALATION_SPRAY | Freq: Four times a day (QID) | RESPIRATORY_TRACT | Status: DC | PRN
  Administered 2016-09-08: 1 via RESPIRATORY_TRACT
  Filled 2016-09-08: qty 6.7

## 2016-09-08 MED ORDER — ALBUTEROL (5 MG/ML) CONTINUOUS INHALATION SOLN
10.0000 mg/h | INHALATION_SOLUTION | RESPIRATORY_TRACT | Status: AC
  Administered 2016-09-08: 10 mg/h via RESPIRATORY_TRACT
  Filled 2016-09-08: qty 20

## 2016-09-08 MED ORDER — LORATADINE 10 MG PO TABS
10.0000 mg | ORAL_TABLET | Freq: Every day | ORAL | 0 refills | Status: DC
Start: 2016-09-08 — End: 2017-02-03

## 2016-09-08 MED ORDER — PREDNISONE 10 MG PO TABS: ORAL_TABLET | ORAL | 0 refills | Status: DC

## 2016-09-08 NOTE — ED Notes (Signed)
Patient ambulated around nurses station and back to room. Sats maintained at 96% RA.

## 2016-09-08 NOTE — ED Provider Notes (Signed)
[facility]-EMERGENCY DEPT Provider Note   CSN: [id number] Arrival date & time: 09/08/16  0145   By signing my name below, I, Jeremy Molina, attest that this documentation has been prepared under the direction and in the presence of [employer], PA-C. Electronically Signed: Clarisse Molina, Scribe. 09/08/16. 3:25 AM.    History   Chief Complaint Chief Complaint  Patient presents with  . Asthma  . Cough   The history is provided by the patient and medical records. No language interpreter was used.    Jeremy Molina is a 26 y.o. male with h/o asthma and bronchitis, BIB EMS to the Emergency Department with concern for persistent, gradually worsening shortness of breath x 3-4 days. Associated productive cough, wheezing noted. Pt received 10 mg albuterol and 0.5 mg atrovent en route via EMS with mild relief. He adds he has not received steroids as EMS was unable to establish IV PTA. He states he ran out of albuterol ~9-10 days ago. He notes h/o intubation and admission for prior asthma exacerbations. No fevers noted. No other complaints at this time.    Past Medical History:  Diagnosis Date  . Asthma     Patient Active Problem List   Diagnosis Date Noted  . Acute asthma exacerbation 04/01/2016  . Acute respiratory failure with hypoxia (HCC) 04/01/2016  . Asthmatic bronchitis 04/01/2016  . Dehydration, moderate 04/01/2016  . Tachycardia   . Asthma, chronic 01/16/2015  . Status asthmaticus 12/24/2014    History reviewed. No pertinent surgical history.     Home Medications    Prior to Admission medications   Medication Sig Start Date End Date Taking? Authorizing Provider  albuterol (PROVENTIL) (2.5 MG/3ML) 0.083% nebulizer solution Take 3 mLs (2.5 mg total) by nebulization every 6 (six) hours as needed for wheezing or shortness of breath. Dx: J45.50 04/02/16  Yes Rolly Salter, MD  albuterol (PROVENTIL HFA;VENTOLIN HFA) 108 (90 Base) MCG/ACT inhaler Inhale 2 puffs into the lungs  every 6 (six) hours as needed for wheezing or shortness of breath. 09/08/16   Antony Madura, PA-C  amoxicillin-clavulanate (AUGMENTIN) 875-125 MG tablet Take 1 tablet by mouth every 12 (twelve) hours. Patient not taking: Reported on 09/08/2016 08/22/16   Elpidio Anis, PA-C  baclofen (LIORESAL) 10 MG tablet Take 1 tablet (10 mg total) by mouth 3 (three) times daily. Patient not taking: Reported on 09/08/2016 06/06/16   [name], PA-C  budesonide-formoterol [facility]) 160-4.5 MCG/ACT inhaler Inhale 2 puffs into the lungs 2 (two) times daily. Patient not taking: Reported on 09/08/2016 04/02/16   Rolly Salter, MD  guaiFENesin (MUCINEX) 600 MG 12 hr tablet Take 1 tablet (600 mg total) by mouth 2 (two) times daily. Patient not taking: Reported on 09/08/2016 04/02/16   Rolly Salter, MD  loratadine (CLARITIN) 10 MG tablet Take 1 tablet (10 mg total) by mouth daily. 09/08/16   Antony Madura, PA-C  meloxicam (MOBIC) 15 MG tablet Take 1 tablet (15 mg total) by mouth daily. Take 1 daily with food. Patient not taking: Reported on 09/08/2016 06/06/16   [name], PA-C  montelukast (SINGULAIR) 10 MG tablet Take 1 tablet (10 mg total) by mouth at bedtime. Patient not taking: Reported on 09/08/2016 04/02/16   Rolly Salter, MD  predniSONE (DELTASONE) 10 MG tablet Take 50mg  daily for 3days,Take 40mg  daily for 3days,Take 30mg  daily for 3days,Take 20mg  daily for 3days,Take 10mg  daily for 3days, then stop 09/08/16   Antony Madura, PA-C    Family History History reviewed. No pertinent  family history.  Social History Social History  Substance Use Topics  . Smoking status: Current Some Day Smoker    Packs/day: 0.10  . Smokeless tobacco: Never Used  . Alcohol use No     Comment: occassional     Allergies   Patient has no known allergies.   Review of Systems Review of Systems All other systems reviewed and all systems are negative for acute changes except as noted in the HPI and PMH.      Physical Exam Updated Vital Signs BP 126/83 (BP Location: Right Arm)   Pulse (!) 116   Temp 98.1 F (36.7 C) (Oral)   Resp (!) 22   SpO2 97%   Physical Exam  Constitutional: He is oriented to person, place, and time. He appears well-developed and well-nourished. No distress.  HENT:  Head: Normocephalic and atraumatic.  Eyes: Conjunctivae and EOM are normal. No scleral icterus.  Neck: Normal range of motion.  Cardiovascular: Regular rhythm and intact distal pulses.   Mild tachycardia. Likely secondary to recent nebulizer treatment.  Pulmonary/Chest: Effort normal. No respiratory distress. He has wheezes. He has no rales.  Diffuse expiratory wheeze. Cough no nasal flaring, grunting, or retractions. No accessory muscle use. Chest expansion symmetric.  Musculoskeletal: Normal range of motion.  Neurological: He is alert and oriented to person, place, and time.  Skin: Skin is warm and dry. No rash noted. He is not diaphoretic. No erythema. No pallor.  Psychiatric: He has a normal mood and affect. His behavior is normal.  Nursing note and vitals reviewed.    ED Treatments / Results  DIAGNOSTIC STUDIES: Oxygen Saturation is 97% on RA, NL by my interpretation.    COORDINATION OF CARE: 3:23 AM-Discussed next steps with pt. Pt verbalized understanding and is agreeable with the plan. Will order IV medications.  6:50 AM - Patient ambulated in the ED; no hypoxia.   Labs (all labs ordered are listed, but only abnormal results are displayed) Labs Reviewed  CBC WITH DIFFERENTIAL/PLATELET - Abnormal; Notable for the following:       Result Value   WBC 12.9 (*)    Neutro Abs 10.3 (*)    All other components within normal limits  BASIC METABOLIC PANEL - Abnormal; Notable for the following:    Glucose, Bld 109 (*)    BUN 21 (*)    All other components within normal limits    EKG  EKG Interpretation None       Radiology No results found.  Procedures Procedures (including  critical care time)  Medications Ordered in ED Medications  albuterol (PROVENTIL,VENTOLIN) solution continuous neb (0 mg/hr Nebulization Stopped 09/08/16 0425)  albuterol (PROVENTIL HFA;VENTOLIN HFA) 108 (90 Base) MCG/ACT inhaler 1-2 puff (1 puff Inhalation Given 09/08/16 0645)  methylPREDNISolone sodium succinate (SOLU-MEDROL) 125 mg/2 mL injection 125 mg (125 mg Intravenous Given 09/08/16 0350)  magnesium sulfate IVPB 2 g 50 mL (0 g Intravenous Stopped 09/08/16 0456)  ipratropium (ATROVENT) nebulizer solution 0.5 mg (0.5 mg Nebulization Given 09/08/16 0332)    CRITICAL CARE Performed by: Antony MaduraHUMES, Danelia Snodgrass   Total critical care time: 35 minutes  Critical care time was exclusive of separately billable procedures and treating other patients.  Critical care was necessary to treat or prevent imminent or life-threatening deterioration.  Critical care was time spent personally by me on the following activities: development of treatment plan with patient and/or surrogate as well as nursing, discussions with consultants, evaluation of patient's response to treatment, examination of patient, obtaining history  from patient or surrogate, ordering and performing treatments and interventions, ordering and review of laboratory studies, ordering and review of radiographic studies, pulse oximetry and re-evaluation of patient's condition.   Initial Impression / Assessment and Plan / ED Course  I have reviewed the triage vital signs and the nursing notes.  Pertinent labs & imaging results that were available during my care of the patient were reviewed by me and considered in my medical decision making (see chart for details).     Patient presenting for SOB and wheezing c/w asthma exacerbation. He states that he feels improved after continuous albuterol treatment. Magnesium and steroids given. Patient in no distress, on repeat assessment; lung sounds clear.   Patient ambulatory in the ED without signs of  rebound. He is ambulatory in the ED with SpO2 of 96% on room air. No c/o SOB. Patient to be started on prednisone taper. Inhaler given prior to discharge as well as prescriptions for albuterol and claritin. Primary care follow-up advised and return precautions given. Patient discharged in stable condition with no unaddressed concerns.   Final Clinical Impressions(s) / ED Diagnoses   Final diagnoses:  Severe persistent asthma with exacerbation    New Prescriptions Current Discharge Medication List     I personally performed the services described in this documentation, which was scribed in my presence. The recorded information has been reviewed and is accurate.       Antony Madura, PA-C 09/08/16 4098    Palumbo, April, MD 09/08/16 [phone]

## 2016-09-08 NOTE — ED Triage Notes (Signed)
Pt is presented by EMS for evaluation of suspected asthma attack. He received 10mg  albuterol and 0.5 mg of Atrovent en route. Remarks on some improvement, also c/o of productive cough.

## 2017-02-03 ENCOUNTER — Emergency Department (HOSPITAL_COMMUNITY): Payer: Medicare Other

## 2017-02-03 ENCOUNTER — Encounter (HOSPITAL_COMMUNITY): Payer: Self-pay | Admitting: Emergency Medicine

## 2017-02-03 ENCOUNTER — Emergency Department (HOSPITAL_COMMUNITY)
Admission: EM | Admit: 2017-02-03 | Discharge: 2017-02-04 | Disposition: A | Discharge status: Home / Self Care | Payer: Medicare Other | Attending: Emergency Medicine | Admitting: Emergency Medicine

## 2017-02-03 DIAGNOSIS — F17210 Nicotine dependence, cigarettes, uncomplicated: Secondary | ICD-10-CM | POA: Diagnosis not present

## 2017-02-03 DIAGNOSIS — Z79899 Other long term (current) drug therapy: Secondary | ICD-10-CM | POA: Diagnosis not present

## 2017-02-03 DIAGNOSIS — J4541 Moderate persistent asthma with (acute) exacerbation: Secondary | ICD-10-CM

## 2017-02-03 DIAGNOSIS — J45901 Unspecified asthma with (acute) exacerbation: Secondary | ICD-10-CM | POA: Diagnosis not present

## 2017-02-03 DIAGNOSIS — R05 Cough: Secondary | ICD-10-CM | POA: Diagnosis present

## 2017-02-03 MED ORDER — IPRATROPIUM BROMIDE 0.02 % IN SOLN: 0.5000 mg | Freq: Once | RESPIRATORY_TRACT | Status: DC

## 2017-02-03 MED ORDER — ALBUTEROL SULFATE (2.5 MG/3ML) 0.083% IN NEBU: 5.0000 mg | INHALATION_SOLUTION | Freq: Once | RESPIRATORY_TRACT | Status: DC

## 2017-02-03 MED ORDER — AEROCHAMBER PLUS FLO-VU MEDIUM MISC
1.0000 | Freq: Once | Status: AC
  Administered 2017-02-04: 1
  Filled 2017-02-03: qty 1

## 2017-02-03 MED ORDER — IPRATROPIUM BROMIDE 0.02 % IN SOLN
1.0000 mg | Freq: Once | RESPIRATORY_TRACT | Status: AC
  Administered 2017-02-03: 1 mg via RESPIRATORY_TRACT
  Filled 2017-02-03: qty 5

## 2017-02-03 MED ORDER — PREDNISONE 20 MG PO TABS: 40.0000 mg | ORAL_TABLET | Freq: Every day | ORAL | 0 refills | Status: DC

## 2017-02-03 MED ORDER — CETIRIZINE HCL 10 MG PO TABS: 10.0000 mg | ORAL_TABLET | Freq: Every day | ORAL | 1 refills | Status: AC

## 2017-02-03 MED ORDER — ALBUTEROL SULFATE HFA 108 (90 BASE) MCG/ACT IN AERS: 2.0000 | INHALATION_SPRAY | Freq: Four times a day (QID) | RESPIRATORY_TRACT | 1 refills | Status: AC | PRN

## 2017-02-03 MED ORDER — ALBUTEROL SULFATE HFA 108 (90 BASE) MCG/ACT IN AERS
2.0000 | INHALATION_SPRAY | RESPIRATORY_TRACT | Status: DC | PRN
  Administered 2017-02-04: 2 via RESPIRATORY_TRACT
  Filled 2017-02-03: qty 6.7

## 2017-02-03 MED ORDER — CETIRIZINE HCL 5 MG/5ML PO SOLN
5.0000 mg | Freq: Once | ORAL | Status: AC
  Administered 2017-02-03: 5 mg via ORAL
  Filled 2017-02-03: qty 5

## 2017-02-03 MED ORDER — BUDESONIDE-FORMOTEROL FUMARATE 160-4.5 MCG/ACT IN AERO: 2.0000 | INHALATION_SPRAY | Freq: Two times a day (BID) | RESPIRATORY_TRACT | 0 refills | Status: AC

## 2017-02-03 MED ORDER — ALBUTEROL (5 MG/ML) CONTINUOUS INHALATION SOLN
10.0000 mg/h | INHALATION_SOLUTION | RESPIRATORY_TRACT | Status: DC
  Administered 2017-02-03: 10 mg/h via RESPIRATORY_TRACT
  Filled 2017-02-03: qty 20

## 2017-02-03 NOTE — ED Triage Notes (Signed)
Pt brought in by EMS with c/o asthma  Pt states last night he started having difficulty breathing that carried into today  Pt is out of his inhaler  Pt was sating 92% on room air and had wheezing in all fields  Pt was given a nebulizer followed by a duoneb  Sats up to 100%  No resp distress noted

## 2017-02-03 NOTE — ED Provider Notes (Signed)
[facility]-EMERGENCY DEPT Provider Note   CSN: [id number] Arrival date & time: 02/03/17  2115     History   Chief Complaint No chief complaint on file.   HPI Jeremy Molina is a 26 y.o. male with a hx of asthma presents to the Emergency Department complaining of gradual, persistent, progressively worsening cough, nasal congestion, chest tightness, shortness of breath and wheezing onset 2 days ago.  Pt reports he was seen at [facility] for this and Rx an inhaler, but the pharmacy did not have the inhaler.  (Epic record review and Care everywhere does not show a patient encounter for this.) He reports being without his medications since that time.  He reports he was given IV steroids yesterday, but no Rx for them.  Pt reports he was last hospitalized and intubated for his asthma approx 4 years ago.  Pt reports no antihistamine usage at home.  No fever, chills, neck pain, neck stiffness.  Pt reports the breathing treatments have helped his breathing.  Exertion makes the SOB worse.       The history is provided by the patient and medical records. No language interpreter was used.    Past Medical History:  Diagnosis Date  . Asthma     Patient Active Problem List   Diagnosis Date Noted  . Acute asthma exacerbation 04/01/2016  . Acute respiratory failure with hypoxia (HCC) 04/01/2016  . Asthmatic bronchitis 04/01/2016  . Dehydration, moderate 04/01/2016  . Tachycardia   . Asthma, chronic 01/16/2015  . Status asthmaticus 12/24/2014    History reviewed. No pertinent surgical history.     Home Medications    Prior to Admission medications   Medication Sig Start Date End Date Taking? Authorizing Provider  albuterol (PROVENTIL) (2.5 MG/3ML) 0.083% nebulizer solution Take 3 mLs (2.5 mg total) by nebulization every 6 (six) hours as needed for wheezing or shortness of breath. Dx: J45.50 04/02/16  Yes Rolly Salter, MD  albuterol (PROVENTIL HFA;VENTOLIN HFA) 108 (90  Base) MCG/ACT inhaler Inhale 2 puffs into the lungs every 6 (six) hours as needed for wheezing or shortness of breath. 02/03/17   Charlotta Lapaglia, Dahlia Client, PA-C  baclofen (LIORESAL) 10 MG tablet Take 1 tablet (10 mg total) by mouth 3 (three) times daily. Patient not taking: Reported on 09/08/2016 06/06/16   [name], PA-C  budesonide-formoterol [facility]) 160-4.5 MCG/ACT inhaler Inhale 2 puffs into the lungs 2 (two) times daily. 02/03/17   Eithel Ryall, Dahlia Client, PA-C  cetirizine (ZYRTEC ALLERGY) 10 MG tablet Take 1 tablet (10 mg total) by mouth daily. 02/03/17   Daryn Pisani, Dahlia Client, PA-C  montelukast (SINGULAIR) 10 MG tablet Take 1 tablet (10 mg total) by mouth at bedtime. Patient not taking: Reported on 09/08/2016 04/02/16   Rolly Salter, MD  predniSONE (DELTASONE) 20 MG tablet Take 2 tablets (40 mg total) by mouth daily. 02/03/17   Delois Silvester, Dahlia Client, PA-C    Family History History reviewed. No pertinent family history.  Social History Social History  Substance Use Topics  . Smoking status: Current Some Day Smoker    Packs/day: 0.10  . Smokeless tobacco: Never Used  . Alcohol use No     Comment: occassional     Allergies   Patient has no known allergies.   Review of Systems Review of Systems  Constitutional: Positive for fatigue. Negative for appetite change, chills and fever.  HENT: Positive for congestion, postnasal drip, rhinorrhea and sinus pressure. Negative for ear discharge, ear pain, mouth sores and sore throat.  Eyes: Negative for visual disturbance.  Respiratory: Positive for cough, chest tightness, shortness of breath and wheezing. Negative for stridor.   Cardiovascular: Negative for chest pain, palpitations and leg swelling.  Gastrointestinal: Negative for abdominal pain, diarrhea, nausea and vomiting.  Genitourinary: Negative for dysuria, frequency, hematuria and urgency.  Musculoskeletal: Negative for arthralgias, back pain, myalgias and neck stiffness.    Skin: Negative for rash.  Neurological: Negative for syncope, light-headedness, numbness and headaches.  Hematological: Negative for adenopathy.  Psychiatric/Behavioral: The patient is not nervous/anxious.   All other systems reviewed and are negative.    Physical Exam Updated Vital Signs BP 117/69 (BP Location: Left Arm)   Pulse 92   Temp 98.3 F (36.8 C) (Oral)   Resp (!) 22   Ht 5\' 6"  (1.676 m)   Wt 65.8 kg (145 lb)   SpO2 100% Comment: on neb tx Simultaneous filing. User may not have seen previous data.  BMI 23.40 kg/m   Physical Exam  Constitutional: He appears well-developed and well-nourished. No distress.  HENT:  Head: Normocephalic and atraumatic.  Right Ear: Tympanic membrane, external ear and ear canal normal.  Left Ear: Tympanic membrane, external ear and ear canal normal.  Nose: Mucosal edema and rhinorrhea present. No epistaxis. Right sinus exhibits no maxillary sinus tenderness and no frontal sinus tenderness. Left sinus exhibits no maxillary sinus tenderness and no frontal sinus tenderness.  Mouth/Throat: Uvula is midline and mucous membranes are normal. Mucous membranes are not pale and not cyanotic. No oropharyngeal exudate, posterior oropharyngeal edema, posterior oropharyngeal erythema or tonsillar abscesses.  Eyes: Pupils are equal, round, and reactive to light. Conjunctivae are normal.  Neck: Normal range of motion and full passive range of motion without pain.  Cardiovascular: Normal rate and intact distal pulses.   Pulmonary/Chest: Effort normal. No accessory muscle usage or stridor. Tachypnea noted. He has decreased breath sounds. He has wheezes (throughout, fine).  Clear and equal breath sounds without focal wheezes, rhonchi, rales  Abdominal: Soft. There is no tenderness.  Musculoskeletal: Normal range of motion.  Lymphadenopathy:    He has no cervical adenopathy.  Neurological: He is alert.  Skin: Skin is warm and dry. No rash noted. He is not  diaphoretic.  Psychiatric: He has a normal mood and affect.  Nursing note and vitals reviewed.    ED Treatments / Results   Radiology Dg Chest 2 View  Result Date: 02/03/2017 CLINICAL DATA:  Cough and wheezing EXAM: CHEST  2 VIEW COMPARISON:  04/01/2016 FINDINGS: The heart size and mediastinal contours are within normal limits. Both lungs are clear. The visualized skeletal structures are unremarkable. IMPRESSION: No active cardiopulmonary disease. Electronically Signed   By: Jasmine PangKim  Fujinaga M.D.   On: 02/03/2017 22:32    Procedures Procedures (including critical care time)  Medications Ordered in ED Medications  ipratropium (ATROVENT) nebulizer solution 1 mg (1 mg Nebulization Given 02/03/17 2227)  cetirizine HCl (Zyrtec) 5 MG/5ML solution 5 mg (5 mg Oral Given 02/03/17 2333)  AEROCHAMBER PLUS FLO-VU MEDIUM MISC 1 each (1 each Other Given 02/04/17 0057)     Initial Impression / Assessment and Plan / ED Course  I have reviewed the triage vital signs and the nursing notes.  Pertinent labs & imaging results that were available during my care of the patient were reviewed by me and considered in my medical decision making (see chart for details).  Clinical Course as of Feb 05 436  Tue Feb 03, 2017  2343 Pt with clear and equal  breath sounds after hour long nebulizer.  Reports he is feeling much better  [HM]  2353 Will refill Symbicort  [HM]    Clinical Course User Index [HM] Drelyn Pistilli, Dahlia Client, PA-C   Pt presents with asthma exacerbation.  Several nebs given prior to my evaluation.  Pt with persistent wheezing and continues to feel SOB.  Will give continuous nebulizer.  11:48 PM Patient ambulated in ED with O2 saturations maintained >90, no current signs of respiratory distress. Lung exam improved after nebulizer treatment. Prednisone given in the ED and pt will be discharged with 5 day burst. Pt states they are breathing at baseline. Pt has been instructed to continue using  prescribed medications and to speak with PCP about today's exacerbation.    Final Clinical Impressions(s) / ED Diagnoses   Final diagnoses:  Moderate persistent asthma with exacerbation    New Prescriptions Discharge Medication List as of 02/03/2017 11:55 PM    START taking these medications   Details  cetirizine (ZYRTEC ALLERGY) 10 MG tablet Take 1 tablet (10 mg total) by mouth daily., Starting Tue 02/03/2017, Print         Charnell Peplinski, [place], PA-C 02/04/17 9562    Alvira Monday, MD 02/04/17 1332

## 2017-02-03 NOTE — Discharge Instructions (Signed)
1. Medications: albuterol, prednisone, usual home medications, symbicort refill 2. Treatment: rest, drink plenty of fluids, begin OTC antihistamine (Zyrtec or Claritin)  3. Follow Up: Please followup with your primary doctor in 2-3 days for discussion of your diagnoses and further evaluation after today's visit; if you do not have a primary care doctor use the resource guide provided to find one; Please return to the ER for difficulty breathing, high fevers or worsening symptoms.

## 2017-02-04 NOTE — ED Notes (Signed)
Pt's O2 at 96%. Pt's gait steady on his feet.

## 2017-03-06 IMAGING — CR DG CHEST 2V
2 series · 2 of 2 positions shown · non-contrast
Comparison: 03/31/2016 .

CLINICAL DATA: Cough.

EXAM:
CHEST  2 VIEW

[chest pa]
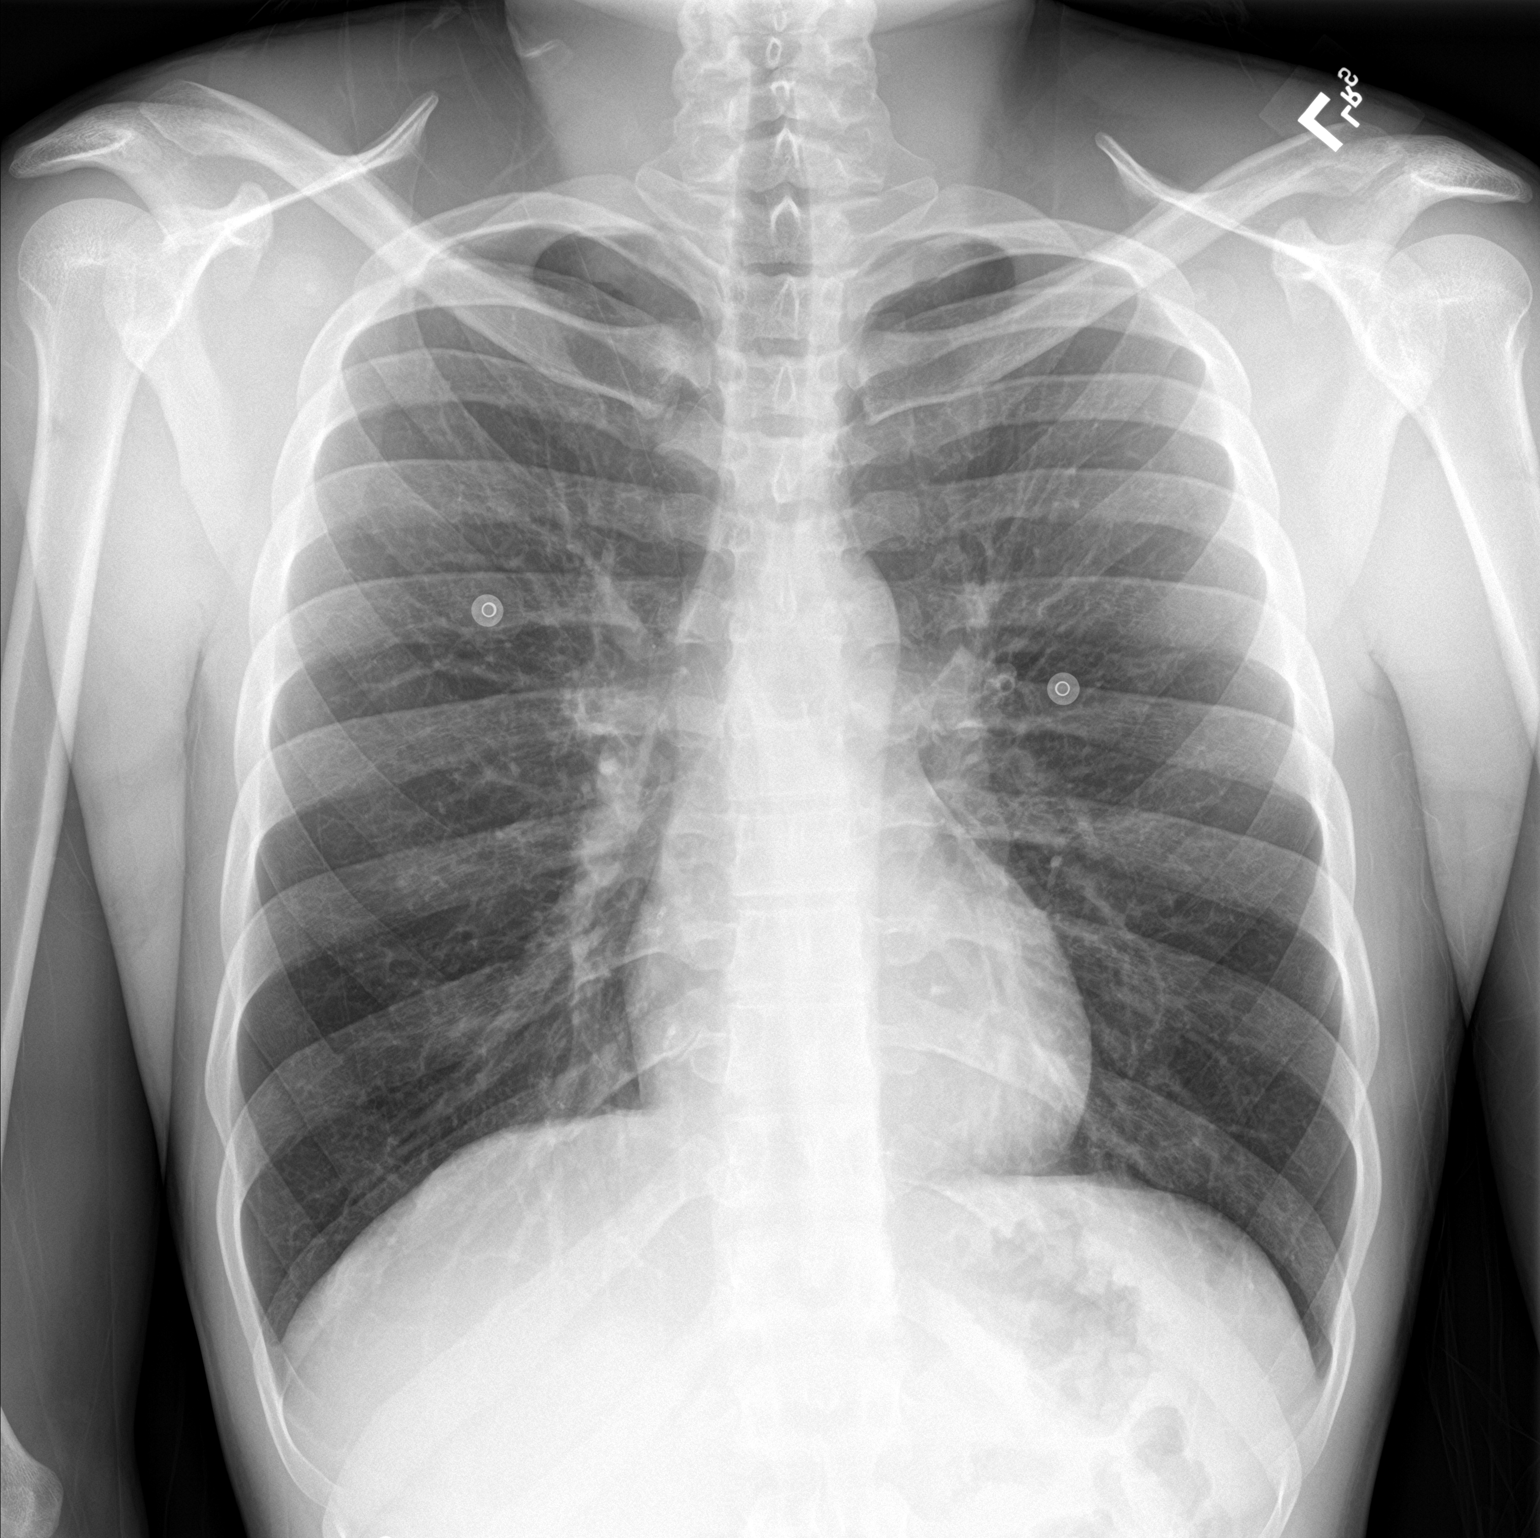

[chest lat]
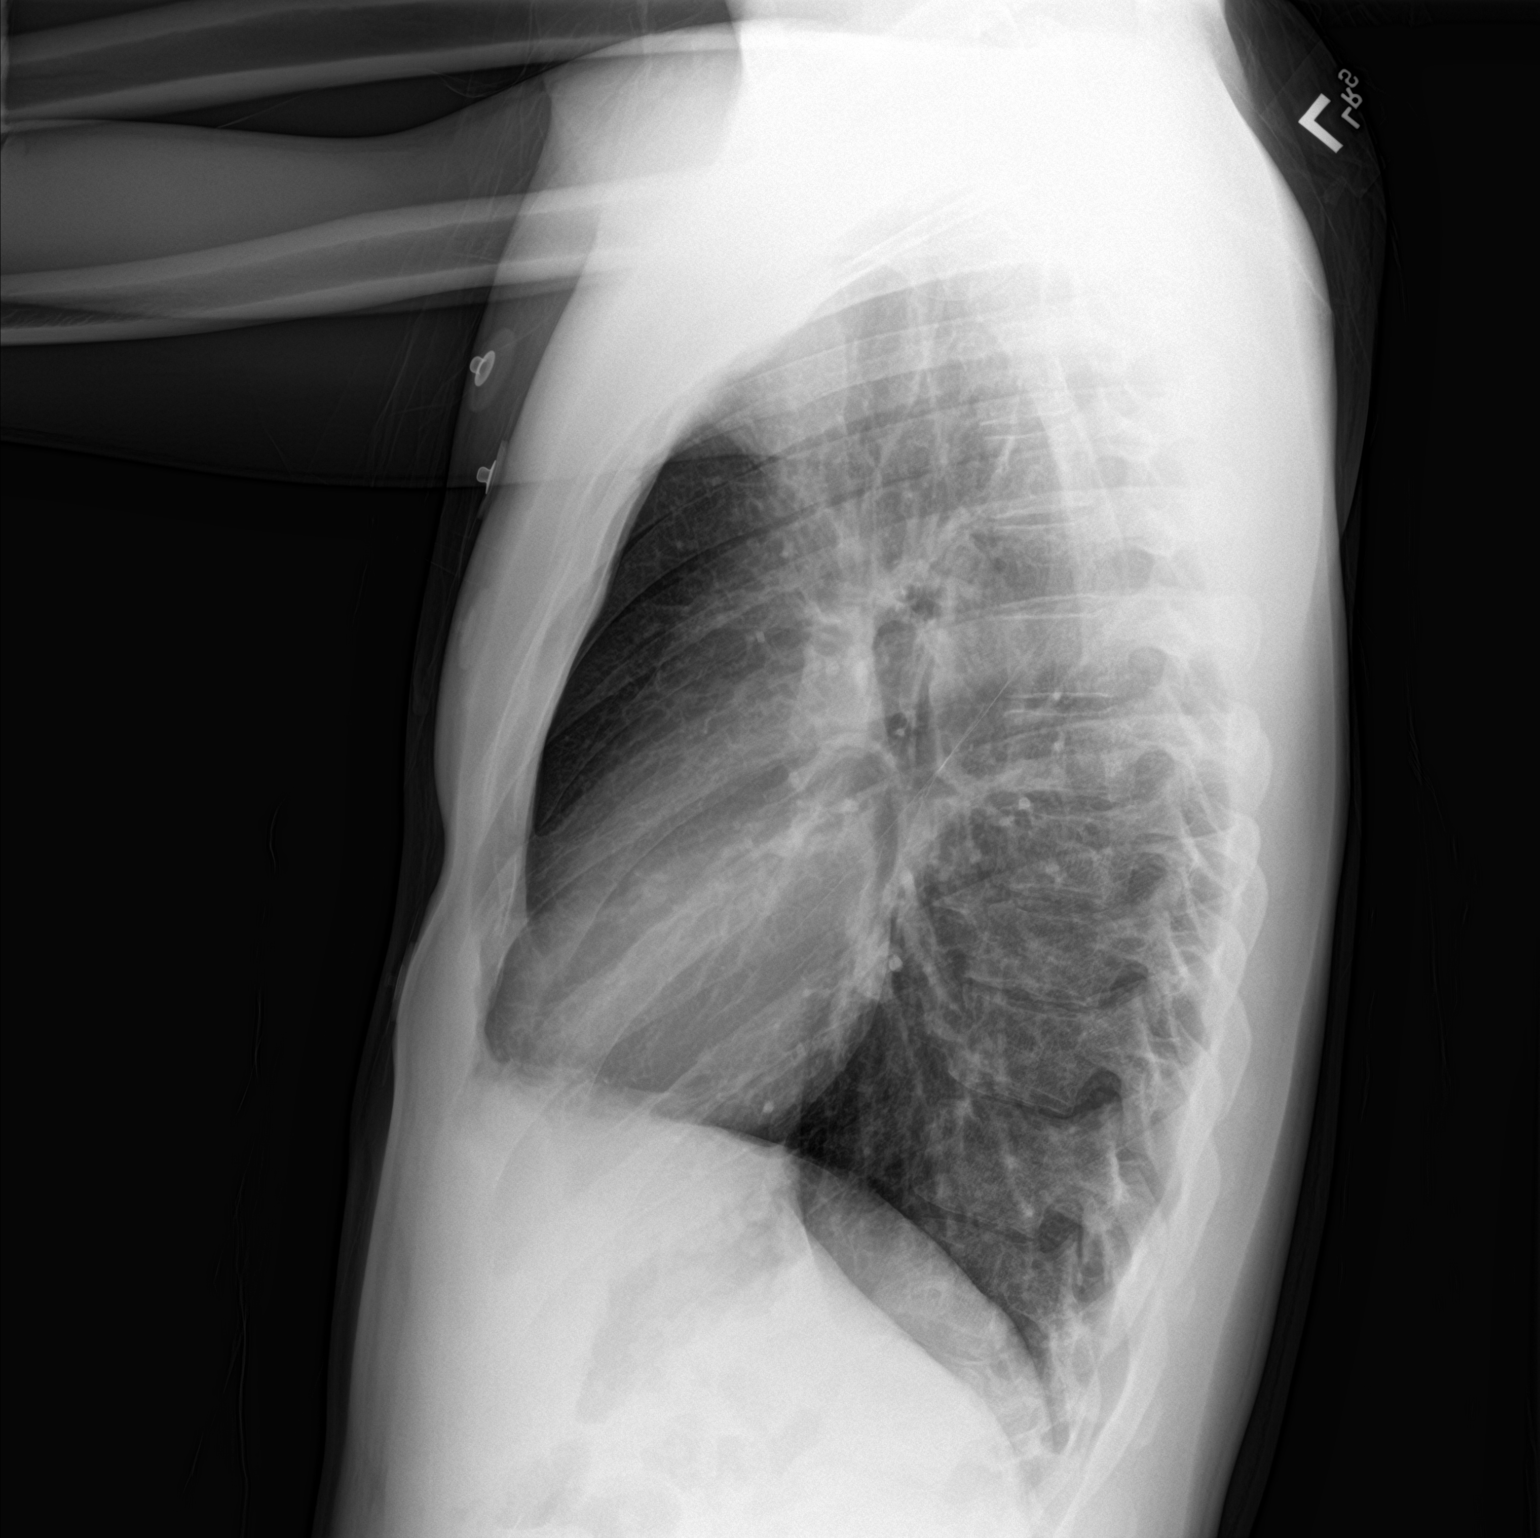

[2 of 2 positions shown; findings below may reference images not displayed]

FINDINGS: Mediastinum and hilar structures are normal. Heart size normal.
Lungs are clear mild left base subsegmental atelectasis. Lungs are
otherwise clear. No pleural effusion or pneumothorax.
IMPRESSION: Mild left base subsegmental atelectasis otherwise negative exam.

## 2017-03-13 ENCOUNTER — Emergency Department (HOSPITAL_COMMUNITY)
Admission: EM | Admit: 2017-03-13 | Discharge: 2017-03-13 | Disposition: A | Discharge status: Home / Self Care | Payer: Medicare Other | Attending: Emergency Medicine | Admitting: Emergency Medicine

## 2017-03-13 ENCOUNTER — Encounter (HOSPITAL_COMMUNITY): Payer: Self-pay | Admitting: Emergency Medicine

## 2017-03-13 DIAGNOSIS — J45901 Unspecified asthma with (acute) exacerbation: Secondary | ICD-10-CM | POA: Diagnosis not present

## 2017-03-13 DIAGNOSIS — J45909 Unspecified asthma, uncomplicated: Secondary | ICD-10-CM | POA: Diagnosis present

## 2017-03-13 DIAGNOSIS — F17200 Nicotine dependence, unspecified, uncomplicated: Secondary | ICD-10-CM | POA: Insufficient documentation

## 2017-03-13 DIAGNOSIS — Z79899 Other long term (current) drug therapy: Secondary | ICD-10-CM | POA: Insufficient documentation

## 2017-03-13 MED ORDER — ALBUTEROL SULFATE HFA 108 (90 BASE) MCG/ACT IN AERS
2.0000 | INHALATION_SPRAY | RESPIRATORY_TRACT | Status: DC | PRN
  Administered 2017-03-13: 2 via RESPIRATORY_TRACT
  Filled 2017-03-13: qty 6.7

## 2017-03-13 MED ORDER — IPRATROPIUM-ALBUTEROL 0.5-2.5 (3) MG/3ML IN SOLN
3.0000 mL | Freq: Once | RESPIRATORY_TRACT | Status: AC
  Administered 2017-03-13: 3 mL via RESPIRATORY_TRACT
  Filled 2017-03-13: qty 3

## 2017-03-13 MED ORDER — ALBUTEROL SULFATE (2.5 MG/3ML) 0.083% IN NEBU: 2.5000 mg | INHALATION_SOLUTION | Freq: Four times a day (QID) | RESPIRATORY_TRACT | 0 refills | Status: AC | PRN

## 2017-03-13 MED ORDER — PREDNISONE 20 MG PO TABS: 40.0000 mg | ORAL_TABLET | Freq: Every day | ORAL | 0 refills | Status: AC

## 2017-03-13 NOTE — ED Provider Notes (Signed)
[facility]   CSN: [id number] Arrival date & time: 03/13/17  0424     History   Chief Complaint Chief Complaint  Patient presents with  . Asthma    HPI Jeremy Molina is a 26 y.o. male.  Patient presents to the ED with a chief complaint of asthma exacerbation.  He states that he began having an exacerbation yesterday and it worsened today.  He states that he is out of his inhaler.  He was giving a breathing treatment and solumedrol and magnesium by EMS.  He denies any measured fever, chills, or productive cough.  He states that has has been intubated in the past.  He reports that he is now feeling improved after meds by EMS.   The history is provided by the patient. No language interpreter was used.    Past Medical History:  Diagnosis Date  . Asthma     Patient Active Problem List   Diagnosis Date Noted  . Acute asthma exacerbation 04/01/2016  . Acute respiratory failure with hypoxia (HCC) 04/01/2016  . Asthmatic bronchitis 04/01/2016  . Dehydration, moderate 04/01/2016  . Tachycardia   . Asthma, chronic 01/16/2015  . Status asthmaticus 12/24/2014    History reviewed. No pertinent surgical history.     Home Medications    Prior to Admission medications   Medication Sig Start Date End Date Taking? Authorizing Provider  albuterol (PROVENTIL HFA;VENTOLIN HFA) 108 (90 Base) MCG/ACT inhaler Inhale 2 puffs into the lungs every 6 (six) hours as needed for wheezing or shortness of breath. Patient not taking: Reported on 03/13/2017 02/03/17   Muthersbaugh, Dahlia ClientHannah, PA-C  albuterol (PROVENTIL) (2.5 MG/3ML) 0.083% nebulizer solution Take 3 mLs (2.5 mg total) by nebulization every 6 (six) hours as needed for wheezing or shortness of breath. Dx: J45.50 Patient not taking: Reported on 03/13/2017 04/02/16   Rolly SalterPatel, Pranav M, MD  baclofen (LIORESAL) 10 MG tablet Take 1 tablet (10 mg total) by mouth 3 (three) times  daily. Patient not taking: Reported on 09/08/2016 06/06/16   Arthor CaptainHarris, Abigail, PA-C  budesonide-formoterol [facility](SYMBICORT) 160-4.5 MCG/ACT inhaler Inhale 2 puffs into the lungs 2 (two) times daily. Patient not taking: Reported on 03/13/2017 02/03/17   Muthersbaugh, Dahlia ClientHannah, PA-C  cetirizine (ZYRTEC ALLERGY) 10 MG tablet Take 1 tablet (10 mg total) by mouth daily. Patient not taking: Reported on 03/13/2017 02/03/17   Muthersbaugh, Dahlia ClientHannah, PA-C  montelukast (SINGULAIR) 10 MG tablet Take 1 tablet (10 mg total) by mouth at bedtime. Patient not taking: Reported on 09/08/2016 04/02/16   Rolly SalterPatel, Pranav M, MD  predniSONE (DELTASONE) 20 MG tablet Take 2 tablets (40 mg total) by mouth daily. Patient not taking: Reported on 03/13/2017 02/03/17   Muthersbaugh, Dahlia ClientHannah, PA-C    Family History No family history on file.  Social History Social History   Tobacco Use  . Smoking status: Current Some Day Smoker    Packs/day: 0.10  . Smokeless tobacco: Never Used  Substance Use Topics  . Alcohol use: No    Comment: occassional  . Drug use: No     Allergies   Patient has no known allergies.   Review of Systems Review of Systems  All other systems reviewed and are negative.    Physical Exam Updated Vital Signs BP 123/83   Pulse 96   Temp 97.6 F (36.4 C) (Oral)   Resp (!) 22   Ht 5\' 8"  (1.727 m)   Wt 74.4 kg (164 lb)   SpO2 96%  BMI 24.94 kg/m   Physical Exam  Constitutional: He is oriented to person, place, and time. He appears well-developed and well-nourished.  HENT:  Head: Normocephalic and atraumatic.  Eyes: Conjunctivae and EOM are normal. Pupils are equal, round, and reactive to light. Right eye exhibits no discharge. Left eye exhibits no discharge. No scleral icterus.  Neck: Normal range of motion. Neck supple. No JVD present.  Cardiovascular: Normal rate, regular rhythm and normal heart sounds. Exam reveals no gallop and no friction rub.  No murmur heard. Pulmonary/Chest: Effort  normal. No respiratory distress. He has wheezes. He has no rales. He exhibits no tenderness.  Mild end expiratory wheezes  Abdominal: Soft. He exhibits no distension and no mass. There is no tenderness. There is no rebound and no guarding.  Musculoskeletal: Normal range of motion. He exhibits no edema or tenderness.  Neurological: He is alert and oriented to person, place, and time.  Skin: Skin is warm and dry.  Psychiatric: He has a normal mood and affect. His behavior is normal. Judgment and thought content normal.  Nursing note and vitals reviewed.    ED Treatments / Results  Labs (all labs ordered are listed, but only abnormal results are displayed) Labs Reviewed - No data to display  EKG  EKG Interpretation None       Radiology No results found.  Procedures Procedures (including critical care time)  Medications Ordered in ED Medications  ipratropium-albuterol (DUONEB) 0.5-2.5 (3) MG/3ML nebulizer solution 3 mL (not administered)     Initial Impression / Assessment and Plan / ED Course  I have reviewed the triage vital signs and the nursing notes.  Pertinent labs & imaging results that were available during my care of the patient were reviewed by me and considered in my medical decision making (see chart for details).    Patient with asthma exacerbation that started yesterday.  He reports that he is out of his meds.  Denies fever or productive cough.  Mild end expiratory wheezes on exam.  Normal  O2 sat on room air.  No respiratory distress. Well appearing.  Will give an additional neb now.  Plan for discharge to home with prednisone and albuterol.  5:52 AM Lungs are clear after 2nd neb.  Final Clinical Impressions(s) / ED Diagnoses   Final diagnoses:  Exacerbation of asthma, unspecified asthma severity, unspecified whether persistent    ED Discharge Orders        Ordered    albuterol (PROVENTIL) (2.5 MG/3ML) 0.083% nebulizer solution  Every 6 hours PRN      03/13/17 0547    predniSONE (DELTASONE) 20 MG tablet  Daily     03/13/17 0547       Roxy HorsemanBrowning, Anjelica Gorniak, PA-C 03/13/17 0548    Roxy HorsemanBrowning, Leanore Biggers, PA-C 03/13/17 [id number]    Geoffery Lyonselo, Douglas, MD 03/13/17 [phone]

## 2017-03-13 NOTE — ED Notes (Signed)
Pt verbalizes understanding of d/c instructions. Pt received prescriptions. Pt ambulatory at d/c with all belongings.  

## 2017-03-13 NOTE — ED Triage Notes (Signed)
Per EMS, pt from home. Pt reports an asthma attack that started about 0330 today. Pt has chronic bronchitis. Expiratory wheezing, 98% room air. EMS gave 10mg  albuterol, 0.5 atrovent, 125 solumedrol, and 2g of Mag Sulfate. Pt reports that his asthma attacks rebound badly, hx of intubation. Pt reports feeling a little bit better r/t breathing. EMS VS 118/83, P 70, SpO2 100%, R 22.
